# Patient Record
Sex: Female | Born: 1970 | Race: Black or African American | Hispanic: No | Marital: Married | Smoking: Current every day smoker
Health system: Southern US, Community
[De-identification: ages and names within clinical notes are randomized; demographics above are authoritative.]

## PROBLEM LIST (undated history)

## (undated) DIAGNOSIS — G43909 Migraine, unspecified, not intractable, without status migrainosus: Secondary | ICD-10-CM

## (undated) DIAGNOSIS — I10 Essential (primary) hypertension: Secondary | ICD-10-CM

## (undated) HISTORY — DX: Migraine, unspecified, not intractable, without status migrainosus: G43.909

## (undated) HISTORY — DX: Essential (primary) hypertension: I10

---

## 1998-06-01 ENCOUNTER — Other Ambulatory Visit: Admission: RE | Admit: 1998-06-01 | Discharge: 1998-06-01 | Payer: Self-pay | Admitting: Obstetrics and Gynecology

## 1999-12-05 ENCOUNTER — Emergency Department (HOSPITAL_COMMUNITY): Admission: EM | Admit: 1999-12-05 | Discharge: 1999-12-05 | Payer: Self-pay | Admitting: Emergency Medicine

## 2001-02-28 ENCOUNTER — Inpatient Hospital Stay (HOSPITAL_COMMUNITY): Admission: AD | Admit: 2001-02-28 | Discharge: 2001-02-28 | Payer: Self-pay | Admitting: Obstetrics and Gynecology

## 2001-07-31 ENCOUNTER — Encounter: Payer: Self-pay | Admitting: Emergency Medicine

## 2001-07-31 ENCOUNTER — Emergency Department (HOSPITAL_COMMUNITY): Admission: EM | Admit: 2001-07-31 | Discharge: 2001-07-31 | Payer: Self-pay | Admitting: Emergency Medicine

## 2001-08-27 ENCOUNTER — Inpatient Hospital Stay (HOSPITAL_COMMUNITY): Admission: AD | Admit: 2001-08-27 | Discharge: 2001-08-27 | Payer: Self-pay | Admitting: Obstetrics

## 2001-09-06 ENCOUNTER — Inpatient Hospital Stay (HOSPITAL_COMMUNITY): Admission: AD | Admit: 2001-09-06 | Discharge: 2001-09-06 | Payer: Self-pay | Admitting: *Deleted

## 2001-09-28 ENCOUNTER — Inpatient Hospital Stay (HOSPITAL_COMMUNITY): Admission: AD | Admit: 2001-09-28 | Discharge: 2001-09-28 | Payer: Self-pay | Admitting: Obstetrics and Gynecology

## 2001-12-23 ENCOUNTER — Encounter: Payer: Self-pay | Admitting: Emergency Medicine

## 2001-12-23 ENCOUNTER — Emergency Department (HOSPITAL_COMMUNITY): Admission: EM | Admit: 2001-12-23 | Discharge: 2001-12-23 | Payer: Self-pay | Admitting: Emergency Medicine

## 2002-02-17 ENCOUNTER — Emergency Department (HOSPITAL_COMMUNITY): Admission: EM | Admit: 2002-02-17 | Discharge: 2002-02-17 | Payer: Self-pay | Admitting: Emergency Medicine

## 2002-04-22 ENCOUNTER — Emergency Department (HOSPITAL_COMMUNITY): Admission: EM | Admit: 2002-04-22 | Discharge: 2002-04-22 | Payer: Self-pay | Admitting: Emergency Medicine

## 2003-01-12 ENCOUNTER — Emergency Department (HOSPITAL_COMMUNITY): Admission: EM | Admit: 2003-01-12 | Discharge: 2003-01-12 | Payer: Self-pay | Admitting: Emergency Medicine

## 2003-01-12 ENCOUNTER — Encounter: Payer: Self-pay | Admitting: Emergency Medicine

## 2003-06-21 ENCOUNTER — Other Ambulatory Visit: Admission: RE | Admit: 2003-06-21 | Discharge: 2003-06-21 | Payer: Self-pay | Admitting: Obstetrics and Gynecology

## 2004-04-13 ENCOUNTER — Emergency Department (HOSPITAL_COMMUNITY): Admission: EM | Admit: 2004-04-13 | Discharge: 2004-04-13 | Payer: Self-pay | Admitting: Emergency Medicine

## 2004-12-25 ENCOUNTER — Emergency Department (HOSPITAL_COMMUNITY): Admission: EM | Admit: 2004-12-25 | Discharge: 2004-12-25 | Payer: Self-pay | Admitting: Emergency Medicine

## 2005-02-06 ENCOUNTER — Emergency Department (HOSPITAL_COMMUNITY): Admission: EM | Admit: 2005-02-06 | Discharge: 2005-02-06 | Payer: Self-pay | Admitting: Emergency Medicine

## 2005-04-06 ENCOUNTER — Emergency Department (HOSPITAL_COMMUNITY): Admission: EM | Admit: 2005-04-06 | Discharge: 2005-04-06 | Payer: Self-pay | Admitting: Emergency Medicine

## 2005-09-04 ENCOUNTER — Emergency Department (HOSPITAL_COMMUNITY): Admission: EM | Admit: 2005-09-04 | Discharge: 2005-09-04 | Payer: Self-pay | Admitting: Emergency Medicine

## 2005-09-16 ENCOUNTER — Emergency Department (HOSPITAL_COMMUNITY): Admission: EM | Admit: 2005-09-16 | Discharge: 2005-09-16 | Payer: Self-pay | Admitting: Family Medicine

## 2005-09-29 ENCOUNTER — Emergency Department (HOSPITAL_COMMUNITY): Admission: EM | Admit: 2005-09-29 | Discharge: 2005-09-29 | Payer: Self-pay | Admitting: Emergency Medicine

## 2005-10-03 ENCOUNTER — Emergency Department (HOSPITAL_COMMUNITY): Admission: EM | Admit: 2005-10-03 | Discharge: 2005-10-03 | Payer: Self-pay | Admitting: Podiatry

## 2005-10-04 ENCOUNTER — Emergency Department (HOSPITAL_COMMUNITY): Admission: EM | Admit: 2005-10-04 | Discharge: 2005-10-04 | Payer: Self-pay | Admitting: Emergency Medicine

## 2006-01-11 ENCOUNTER — Emergency Department (HOSPITAL_COMMUNITY): Admission: EM | Admit: 2006-01-11 | Discharge: 2006-01-11 | Payer: Self-pay | Admitting: Emergency Medicine

## 2006-04-11 ENCOUNTER — Emergency Department (HOSPITAL_COMMUNITY): Admission: EM | Admit: 2006-04-11 | Discharge: 2006-04-11 | Payer: Self-pay | Admitting: Emergency Medicine

## 2006-06-28 ENCOUNTER — Emergency Department (HOSPITAL_COMMUNITY): Admission: EM | Admit: 2006-06-28 | Discharge: 2006-06-28 | Payer: Self-pay | Admitting: Emergency Medicine

## 2006-07-21 ENCOUNTER — Emergency Department (HOSPITAL_COMMUNITY): Admission: EM | Admit: 2006-07-21 | Discharge: 2006-07-21 | Payer: Self-pay | Admitting: Emergency Medicine

## 2006-08-12 ENCOUNTER — Inpatient Hospital Stay (HOSPITAL_COMMUNITY): Admission: AD | Admit: 2006-08-12 | Discharge: 2006-08-12 | Payer: Self-pay | Admitting: Obstetrics and Gynecology

## 2006-09-12 ENCOUNTER — Other Ambulatory Visit: Admission: RE | Admit: 2006-09-12 | Discharge: 2006-09-12 | Payer: Self-pay | Admitting: Gynecology

## 2006-09-30 ENCOUNTER — Ambulatory Visit (HOSPITAL_COMMUNITY): Admission: RE | Admit: 2006-09-30 | Discharge: 2006-09-30 | Payer: Self-pay | Admitting: Gynecology

## 2007-02-09 IMAGING — CT CT ABDOMEN W/O CM
1 series · 15 of 32 positions shown, 19 images · IV contrast (agent unspecified)
Comparison: none

CLINICAL DATA: Back pain.  
 ABDOMEN CT WITHOUT CONTRAST:
TECHNIQUE: Multidetector CT imaging of the abdomen was performed following the standard protocol without IV contrast.
TECHNIQUE: Multidetector CT imaging of the pelvis was performed following the standard protocol without IV contrast.

[Series 2: stone_wo 5.0 b40f st · axial · 0.62mm/px · z∈[-352,-24]mm · 15 of 91 slices shown, 19 images]
[im 6/91  soft-tissue]
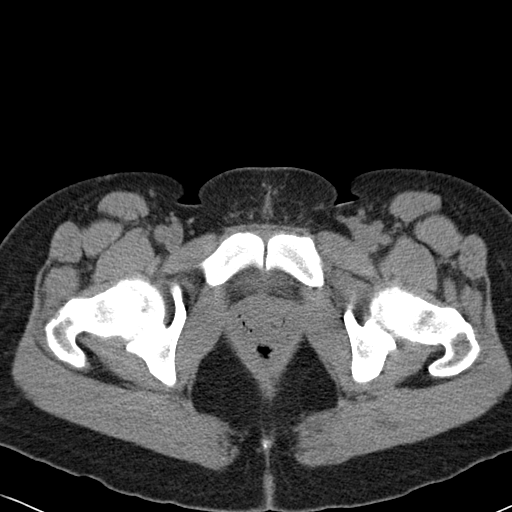
[im 6/91  bone]
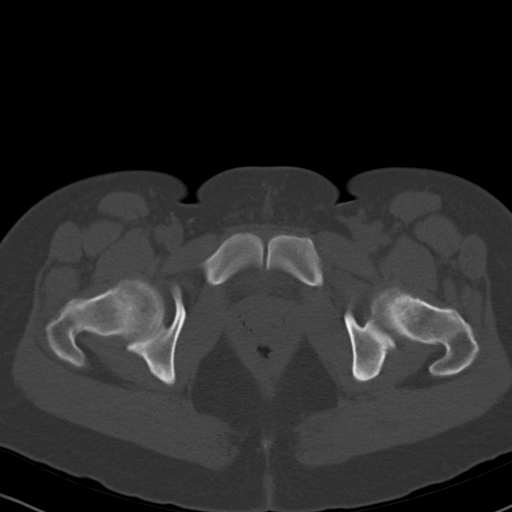
[im 12/91  soft-tissue]
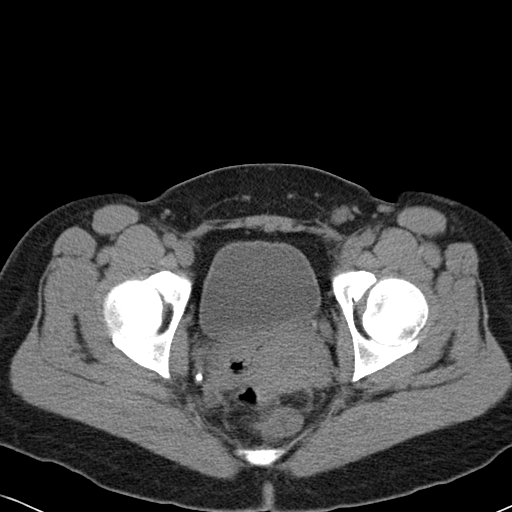
[im 18/91  soft-tissue]
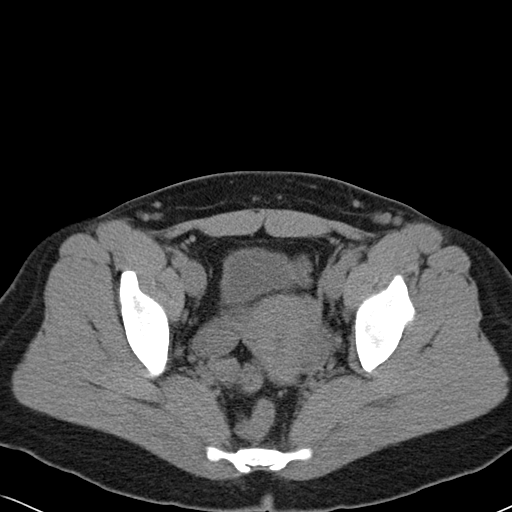
[im 27/91  soft-tissue]
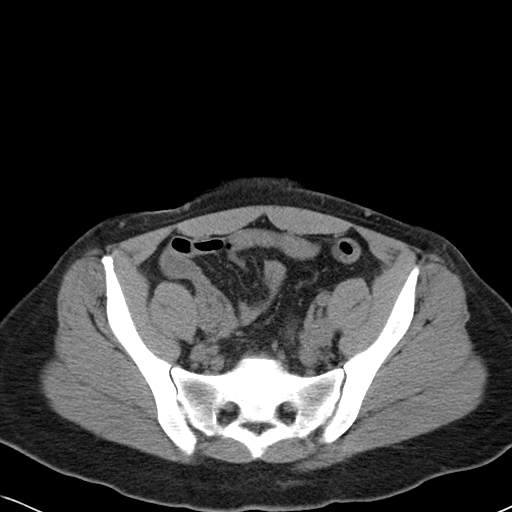
[im 32/91  soft-tissue]
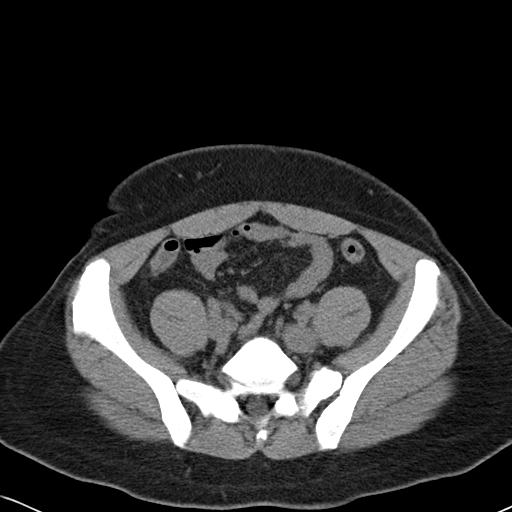
[im 38/91  soft-tissue]
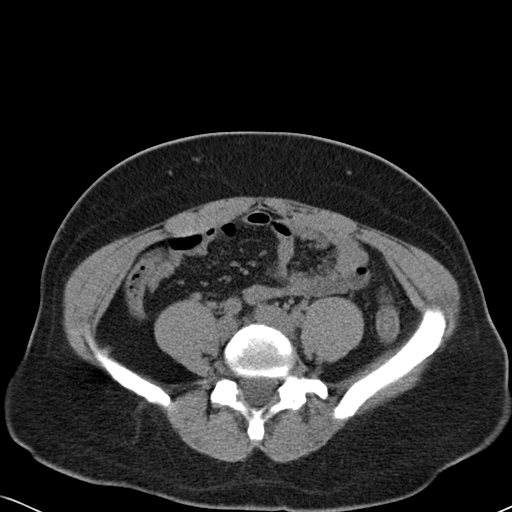
[im 47/91  soft-tissue]
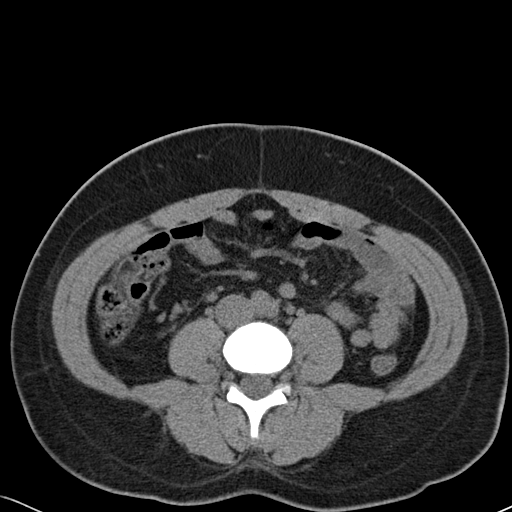
[im 53/91  soft-tissue]
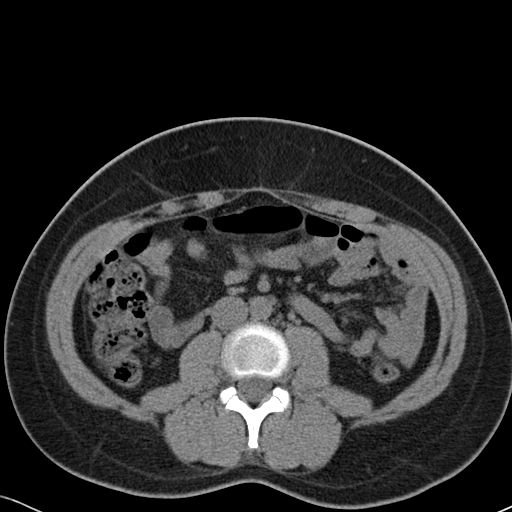
[im 59/91  soft-tissue]
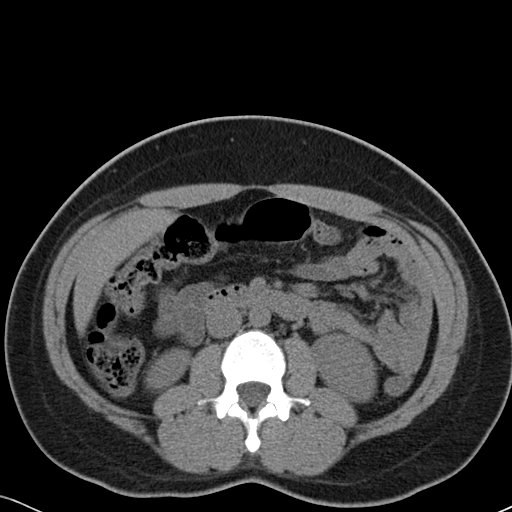
[im 59/91  bone]
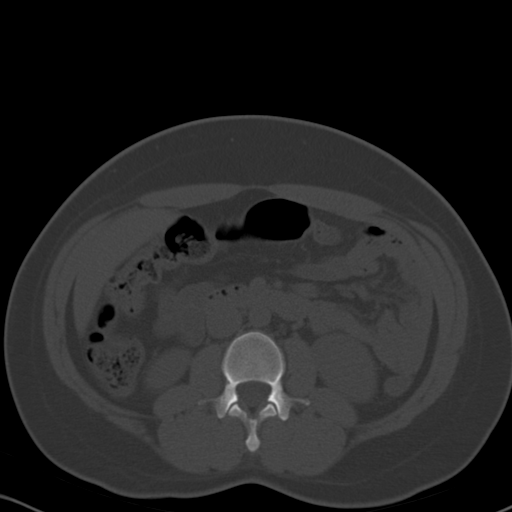
[im 64/91  soft-tissue]
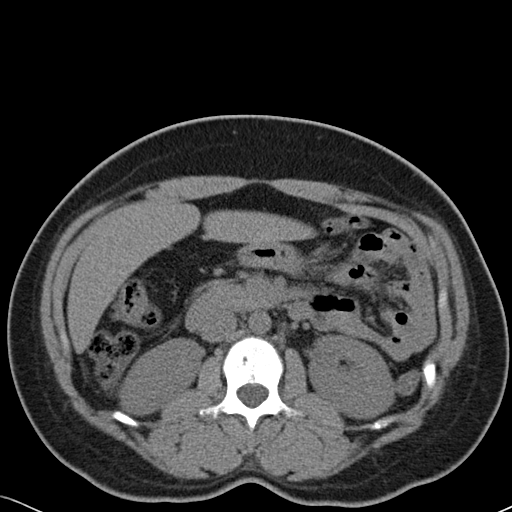
[im 73/91  soft-tissue]
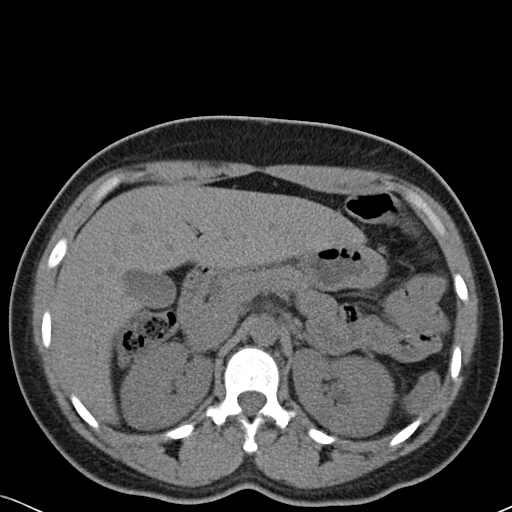
[im 79/91  soft-tissue]
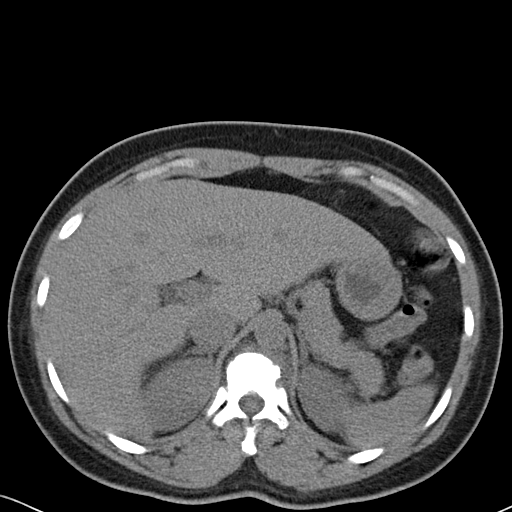
[im 79/91  lung]
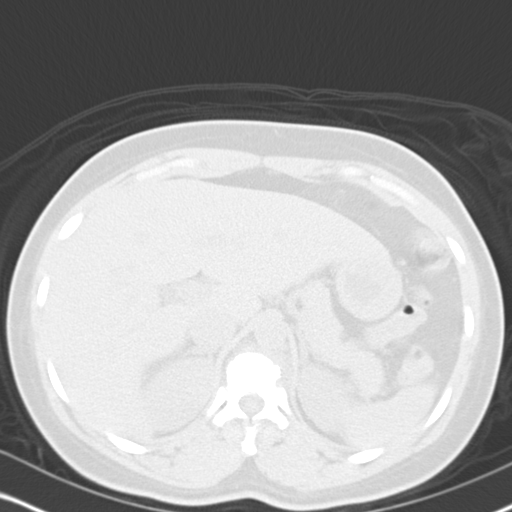
[im 82/91  lung]
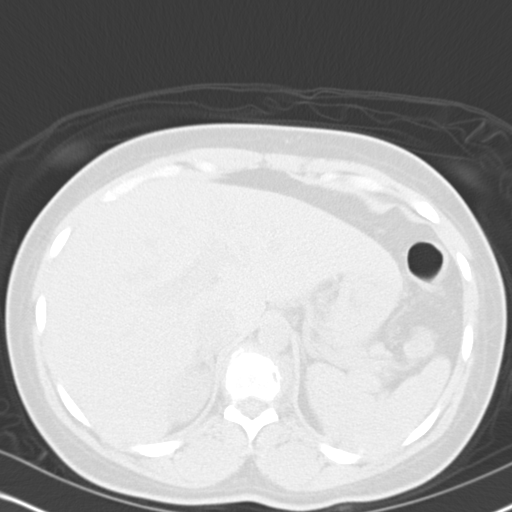
[im 85/91  soft-tissue]
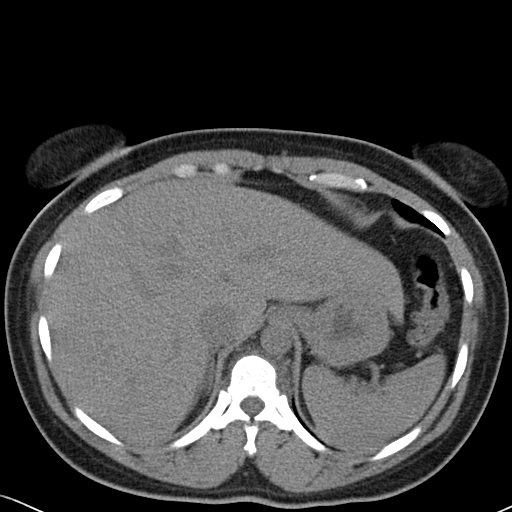
[im 85/91  lung]
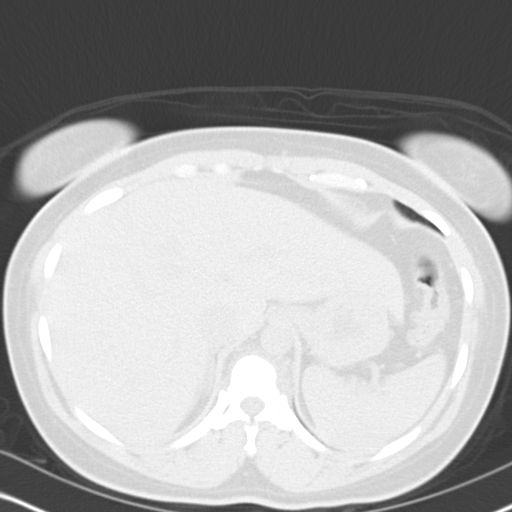
[im 88/91  lung]
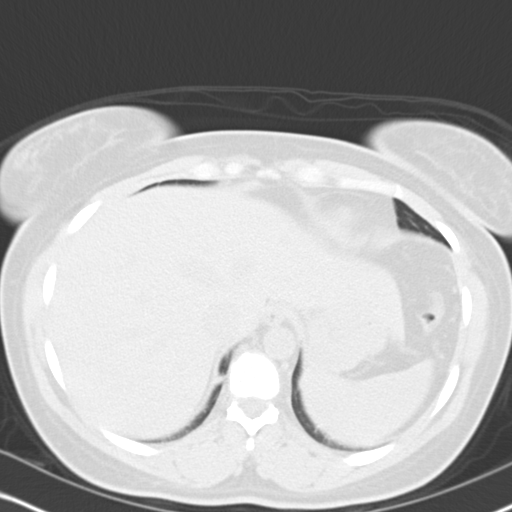

[15 of 32 positions shown; findings below may reference images not displayed]

FINDINGS: The liver appears normal in the unenhanced state.  No calcified gallstones are seen.  The pancreas is normal in size as are the adrenal glands and the spleen.  The kidneys enhance normally with no renal calculi and no hydronephrosis.  The abdominal aorta is normal in caliber.
IMPRESSION: Negative CT of the abdomen.  No renal calculi.  No hydronephrosis.
 PELVIS CT WITHOUT CONTRAST:
FINDINGS: Scans were continued through the pelvis in the unenhanced state.  The appendix is well seen and appears normal.  The terminal ileum also is normal.  The urinary bladder is unremarkable.  The uterus is grossly normal.  Small ovarian follicles are present.  No free fluid is noted.
IMPRESSION: No acute abnormality on CT of the pelvis.  Small ovarian follicles.  The appendix and terminal ileum appear normal.

## 2007-03-06 ENCOUNTER — Emergency Department (HOSPITAL_COMMUNITY): Admission: EM | Admit: 2007-03-06 | Discharge: 2007-03-07 | Payer: Self-pay | Admitting: Emergency Medicine

## 2007-05-22 ENCOUNTER — Emergency Department (HOSPITAL_COMMUNITY): Admission: EM | Admit: 2007-05-22 | Discharge: 2007-05-23 | Payer: Self-pay | Admitting: Emergency Medicine

## 2007-10-06 ENCOUNTER — Emergency Department (HOSPITAL_COMMUNITY): Admission: EM | Admit: 2007-10-06 | Discharge: 2007-10-06 | Payer: Self-pay | Admitting: Emergency Medicine

## 2008-03-15 ENCOUNTER — Inpatient Hospital Stay (HOSPITAL_COMMUNITY): Admission: AD | Admit: 2008-03-15 | Discharge: 2008-03-16 | Payer: Self-pay | Admitting: Obstetrics & Gynecology

## 2008-12-23 ENCOUNTER — Inpatient Hospital Stay (HOSPITAL_COMMUNITY): Admission: AD | Admit: 2008-12-23 | Discharge: 2008-12-24 | Payer: Self-pay | Admitting: Obstetrics & Gynecology

## 2009-01-06 ENCOUNTER — Inpatient Hospital Stay (HOSPITAL_COMMUNITY): Admission: AD | Admit: 2009-01-06 | Discharge: 2009-01-06 | Payer: Self-pay | Admitting: Family Medicine

## 2009-02-02 ENCOUNTER — Ambulatory Visit: Payer: Self-pay | Admitting: Obstetrics and Gynecology

## 2009-06-23 ENCOUNTER — Emergency Department (HOSPITAL_COMMUNITY): Admission: EM | Admit: 2009-06-23 | Discharge: 2009-06-23 | Payer: Self-pay | Admitting: Emergency Medicine

## 2009-06-27 ENCOUNTER — Emergency Department (HOSPITAL_COMMUNITY): Admission: EM | Admit: 2009-06-27 | Discharge: 2009-06-27 | Payer: Self-pay | Admitting: Emergency Medicine

## 2009-07-08 ENCOUNTER — Emergency Department (HOSPITAL_COMMUNITY): Admission: EM | Admit: 2009-07-08 | Discharge: 2009-07-08 | Payer: Self-pay | Admitting: Emergency Medicine

## 2009-11-22 ENCOUNTER — Emergency Department (HOSPITAL_COMMUNITY): Admission: EM | Admit: 2009-11-22 | Discharge: 2009-11-22 | Payer: Self-pay | Admitting: Family Medicine

## 2010-01-31 LAB — HM PAP SMEAR

## 2010-05-03 ENCOUNTER — Emergency Department (HOSPITAL_COMMUNITY): Admission: EM | Admit: 2010-05-03 | Discharge: 2010-05-03 | Payer: Self-pay | Admitting: Emergency Medicine

## 2010-05-07 ENCOUNTER — Emergency Department (HOSPITAL_COMMUNITY): Admission: EM | Admit: 2010-05-07 | Discharge: 2010-05-07 | Payer: Self-pay | Admitting: Emergency Medicine

## 2010-09-04 ENCOUNTER — Inpatient Hospital Stay (HOSPITAL_COMMUNITY)
Admission: AD | Admit: 2010-09-04 | Discharge: 2010-09-05 | Payer: Self-pay | Source: Home / Self Care | Attending: Obstetrics & Gynecology | Admitting: Obstetrics & Gynecology

## 2010-09-11 LAB — GC/CHLAMYDIA PROBE AMP, GENITAL
Chlamydia, DNA Probe: NEGATIVE
GC Probe Amp, Genital: NEGATIVE

## 2010-09-11 LAB — CBC
HCT: 40.9 % (ref 36.0–46.0)
Hemoglobin: 14 g/dL (ref 12.0–15.0)
MCH: 30.6 pg (ref 26.0–34.0)
MCHC: 34.2 g/dL (ref 30.0–36.0)
MCV: 89.3 fL (ref 78.0–100.0)
Platelets: 264 10*3/uL (ref 150–400)
RBC: 4.58 MIL/uL (ref 3.87–5.11)
RDW: 12.7 % (ref 11.5–15.5)
WBC: 5.5 10*3/uL (ref 4.0–10.5)

## 2010-09-11 LAB — WET PREP, GENITAL
Trich, Wet Prep: NONE SEEN
Yeast Wet Prep HPF POC: NONE SEEN

## 2010-09-11 LAB — COMPREHENSIVE METABOLIC PANEL
ALT: 18 U/L (ref 0–35)
AST: 20 U/L (ref 0–37)
Albumin: 3.7 g/dL (ref 3.5–5.2)
Alkaline Phosphatase: 72 U/L (ref 39–117)
BUN: 11 mg/dL (ref 6–23)
CO2: 26 mEq/L (ref 19–32)
Calcium: 9.1 mg/dL (ref 8.4–10.5)
Chloride: 106 mEq/L (ref 96–112)
Creatinine, Ser: 0.96 mg/dL (ref 0.4–1.2)
GFR calc Af Amer: >60 mL/min (ref >60)
GFR calc non Af Amer: >60 mL/min (ref >60)
Glucose, Bld: 100 mg/dL — ABNORMAL HIGH (ref 70–99)
Potassium: 3.9 mEq/L (ref 3.5–5.1)
Sodium: 137 mEq/L (ref 135–145)
Total Bilirubin: 0.2 mg/dL — ABNORMAL LOW (ref 0.3–1.2)
Total Protein: 6.6 g/dL (ref 6.0–8.3)

## 2010-09-11 LAB — DIFFERENTIAL
Basophils Absolute: 0 10*3/uL (ref 0.0–0.1)
Basophils Relative: 0 % (ref 0–1)
Eosinophils Absolute: 0.2 10*3/uL (ref 0.0–0.7)
Eosinophils Relative: 3 % (ref 0–5)
Lymphocytes Relative: 37 % (ref 12–46)
Lymphs Abs: 2 10*3/uL (ref 0.7–4.0)
Monocytes Absolute: 0.4 10*3/uL (ref 0.1–1.0)
Monocytes Relative: 8 % (ref 3–12)
Neutro Abs: 2.9 10*3/uL (ref 1.7–7.7)
Neutrophils Relative %: 52 % (ref 43–77)

## 2010-09-11 LAB — URINALYSIS, ROUTINE W REFLEX MICROSCOPIC
Bilirubin Urine: NEGATIVE
Hgb urine dipstick: NEGATIVE
Ketones, ur: NEGATIVE mg/dL
Nitrite: NEGATIVE
Protein, ur: NEGATIVE mg/dL
Specific Gravity, Urine: 1.015 (ref 1.005–1.030)
Urine Glucose, Fasting: NEGATIVE mg/dL
Urobilinogen, UA: 0.2 mg/dL (ref 0.0–1.0)
pH: 7 (ref 5.0–8.0)

## 2010-09-11 LAB — POCT PREGNANCY, URINE: Preg Test, Ur: NEGATIVE

## 2010-11-30 LAB — URINALYSIS, ROUTINE W REFLEX MICROSCOPIC
Glucose, UA: NEGATIVE mg/dL
Ketones, ur: NEGATIVE mg/dL
pH: 5.5 (ref 5.0–8.0)

## 2010-11-30 LAB — URINE MICROSCOPIC-ADD ON

## 2010-12-05 LAB — CBC
HCT: 39.4 % (ref 36.0–46.0)
MCHC: 34.8 g/dL (ref 30.0–36.0)
MCV: 92.2 fL (ref 78.0–100.0)
Platelets: 245 10*3/uL (ref 150–400)
RDW: 13.3 % (ref 11.5–15.5)

## 2010-12-05 LAB — SAMPLE TO BLOOD BANK

## 2011-05-25 LAB — URINE MICROSCOPIC-ADD ON

## 2011-05-25 LAB — DIFFERENTIAL
Eosinophils Absolute: 0.2
Eosinophils Relative: 3
Lymphs Abs: 1.4
Monocytes Relative: 9

## 2011-05-25 LAB — URINALYSIS, ROUTINE W REFLEX MICROSCOPIC
Bilirubin Urine: NEGATIVE
Nitrite: NEGATIVE
Protein, ur: NEGATIVE
Specific Gravity, Urine: 1.01
Urobilinogen, UA: 1

## 2011-05-25 LAB — CBC
HCT: 40.6
Hemoglobin: 13.7
MCV: 92.1
RBC: 4.41
WBC: 4.7

## 2011-05-25 LAB — WET PREP, GENITAL: Trich, Wet Prep: NONE SEEN

## 2011-05-25 LAB — POCT PREGNANCY, URINE: Operator id: 26670

## 2011-05-28 ENCOUNTER — Encounter: Payer: Self-pay | Admitting: *Deleted

## 2011-05-28 DIAGNOSIS — I10 Essential (primary) hypertension: Secondary | ICD-10-CM | POA: Insufficient documentation

## 2011-05-28 DIAGNOSIS — G43909 Migraine, unspecified, not intractable, without status migrainosus: Secondary | ICD-10-CM | POA: Insufficient documentation

## 2011-06-24 ENCOUNTER — Inpatient Hospital Stay (INDEPENDENT_AMBULATORY_CARE_PROVIDER_SITE_OTHER)
Admission: RE | Admit: 2011-06-24 | Discharge: 2011-06-24 | Disposition: A | Discharge status: Home / Self Care | Payer: Managed Care, Other (non HMO) | Source: Ambulatory Visit | Attending: Family Medicine | Admitting: Family Medicine

## 2011-06-24 DIAGNOSIS — J069 Acute upper respiratory infection, unspecified: Secondary | ICD-10-CM

## 2011-12-03 ENCOUNTER — Emergency Department (HOSPITAL_COMMUNITY)
Admission: EM | Admit: 2011-12-03 | Discharge: 2011-12-03 | Disposition: A | Discharge status: Home / Self Care | Payer: Self-pay | Attending: Emergency Medicine | Admitting: Emergency Medicine

## 2011-12-03 ENCOUNTER — Encounter (HOSPITAL_COMMUNITY): Payer: Self-pay | Admitting: Emergency Medicine

## 2011-12-03 DIAGNOSIS — M25539 Pain in unspecified wrist: Secondary | ICD-10-CM | POA: Insufficient documentation

## 2011-12-03 MED ORDER — ACETAMINOPHEN-CODEINE #3 300-30 MG PO TABS: 1.0000 | ORAL_TABLET | Freq: Four times a day (QID) | ORAL | Status: AC | PRN

## 2011-12-03 MED ORDER — IBUPROFEN 800 MG PO TABS: 800.0000 mg | ORAL_TABLET | Freq: Three times a day (TID) | ORAL | Status: AC

## 2011-12-03 MED ORDER — OXYCODONE-ACETAMINOPHEN 5-325 MG PO TABS
1.0000 | ORAL_TABLET | Freq: Once | ORAL | Status: AC
  Administered 2011-12-03: 1 via ORAL
  Filled 2011-12-03: qty 1

## 2011-12-03 MED ORDER — IBUPROFEN 800 MG PO TABS
800.0000 mg | ORAL_TABLET | Freq: Once | ORAL | Status: AC
  Administered 2011-12-03: 800 mg via ORAL
  Filled 2011-12-03: qty 1

## 2011-12-03 NOTE — Discharge Instructions (Signed)
Wear splint for comfort. Ice wrist to reduce inflammation and pain. Take ibuprofen as directed for inflammation and pain with tylenol #3 for breakthrough pain but do not drive or operate machinery with tylenol #3 use. Follow up with hand specialist in 1-2 weeks for recheck of ongoing hand pain but return to ER for emergent changing or worsening of symptoms.

## 2011-12-03 NOTE — ED Notes (Signed)
Pt is c/o right hand pain  Pt denies injury  Pt states the pain is in the wrist area  No signs of swelling noted

## 2011-12-04 NOTE — ED Provider Notes (Signed)
History     CSN: [id number]  Arrival date & time 12/03/11  2102   First MD Initiated Contact with Patient 12/03/11 2311      Chief Complaint  Patient presents with  . Hand Pain    (Consider location/radiation/quality/duration/timing/severity/associated sxs/prior treatment) HPI  Patient presents to ER complaining of a few day hx of gradual onset right wrist pain. 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 Pain is aggravated by movement and improved with keeping wrist still. Has taken nothing for pain PTA. Denies erythema or swelling of wrist. Denies addition joint pain. Denies hx if similar pain. Denies radiation of pain. Pain was gradual onset, persistent and aggravated by movement.  Past Medical History  Diagnosis Date  . Migraines   . Asthma   . Hypertension     History reviewed. No pertinent past surgical history.  Family History  Problem Relation Age of Onset  . Diabetes Father   . Heart attack Father   . Hypertension Father   . Cancer Father   . Diabetes Mother   . Hypertension Mother     History  Substance Use Topics  . Smoking status: Current Everyday Smoker    Types: Cigarettes  . Smokeless tobacco: Not on file  . Alcohol Use: No    OB History    Grav Para Term Preterm Abortions TAB SAB Ect Mult Living   0 0 0 0 0 0 0 0 0 0       Review of Systems  Allergies  Review of patient's allergies indicates no known allergies.  Home Medications   Current Outpatient Rx  Name Route Sig Dispense Refill  . AZOR PO Oral Take 5 mg by mouth daily.    . ACETAMINOPHEN-CODEINE #3 300-30 MG PO TABS Oral Take 1-2 tablets by mouth every 6 (six) hours as needed for pain. 15 tablet 0  . IBUPROFEN 800 MG PO TABS Oral Take 1 tablet (800 mg total) by mouth 3 (three) times daily. 21 tablet 0  . METOCLOPRAMIDE HCL 5 MG PO TABS Oral Take 5 mg by mouth 4 (four) times daily as needed.      . TOPIRAMATE 100 MG PO TABS Oral Take 100 mg by mouth  daily.        BP 129/94  Pulse 88  Temp(Src) 98.9 F (37.2 C) (Oral)  SpO2 98%  LMP 11/30/2011  Physical Exam  Constitutional: She is oriented to person, place, and time. She appears well-developed and well-nourished. No distress.  HENT:  Head: Normocephalic and atraumatic.  Eyes: Conjunctivae are normal.  Cardiovascular: Normal rate and regular rhythm.   Pulmonary/Chest: Effort normal.  Musculoskeletal: She exhibits no edema.       Right ankle: She exhibits swelling. tenderness.       TTP of entire right wrist with decreased ROM due to pain but no erythema or swelling. Good radial pulse and cap refill. 5/5 grip. No TTP or swelling of forearm.    Neurological: She is alert and oriented to person, place, and time.       Normal sensation of entire foot.   Skin: Skin is warm and dry. No rash noted. She is not diaphoretic. No erythema. No pallor.  Psychiatric: She has a normal mood and affect. Her behavior is normal.    ED Course  Procedures (including critical care time)  PO ibuprofen, percocet and wrist splint by ortho tech.   Labs Reviewed - No data to display No  results found.   1. Pain in wrist       MDM  No signs or symptoms of septic joint. No known injury. Patient complaining of pain with ROM but RUE in neurovasc intact. Good radial pulse and 5/5 grip. 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         [place], [place] 12/04/11 0151

## 2011-12-04 NOTE — ED Provider Notes (Signed)
Medical screening examination/treatment/procedure(s) were performed by non-physician practitioner and as supervising physician I was immediately available for consultation/collaboration.  Doug Sou, MD 12/04/11 [phone]

## 2011-12-23 ENCOUNTER — Inpatient Hospital Stay (HOSPITAL_COMMUNITY): Payer: Self-pay

## 2011-12-23 ENCOUNTER — Inpatient Hospital Stay (HOSPITAL_COMMUNITY)
Admission: AD | Admit: 2011-12-23 | Discharge: 2011-12-23 | Disposition: A | Discharge status: Home / Self Care | Payer: Self-pay | Source: Ambulatory Visit | Attending: Family Medicine | Admitting: Family Medicine

## 2011-12-23 ENCOUNTER — Encounter (HOSPITAL_COMMUNITY): Payer: Self-pay | Admitting: *Deleted

## 2011-12-23 DIAGNOSIS — N390 Urinary tract infection, site not specified: Secondary | ICD-10-CM | POA: Insufficient documentation

## 2011-12-23 DIAGNOSIS — R109 Unspecified abdominal pain: Secondary | ICD-10-CM | POA: Insufficient documentation

## 2011-12-23 DIAGNOSIS — N760 Acute vaginitis: Secondary | ICD-10-CM | POA: Insufficient documentation

## 2011-12-23 DIAGNOSIS — A499 Bacterial infection, unspecified: Secondary | ICD-10-CM | POA: Insufficient documentation

## 2011-12-23 DIAGNOSIS — N83209 Unspecified ovarian cyst, unspecified side: Secondary | ICD-10-CM | POA: Insufficient documentation

## 2011-12-23 DIAGNOSIS — B9689 Other specified bacterial agents as the cause of diseases classified elsewhere: Secondary | ICD-10-CM | POA: Insufficient documentation

## 2011-12-23 LAB — URINALYSIS, ROUTINE W REFLEX MICROSCOPIC
Nitrite: POSITIVE — AB
Protein, ur: NEGATIVE mg/dL
Specific Gravity, Urine: 1.02 (ref 1.005–1.030)
Urobilinogen, UA: 1 mg/dL (ref 0.0–1.0)

## 2011-12-23 LAB — URINE MICROSCOPIC-ADD ON

## 2011-12-23 LAB — CBC
HCT: 38.1 % (ref 36.0–46.0)
Hemoglobin: 13 g/dL (ref 12.0–15.0)
MCH: 30.7 pg (ref 26.0–34.0)
MCV: 89.9 fL (ref 78.0–100.0)
RBC: 4.24 MIL/uL (ref 3.87–5.11)

## 2011-12-23 LAB — WET PREP, GENITAL: Yeast Wet Prep HPF POC: NONE SEEN

## 2011-12-23 MED ORDER — METRONIDAZOLE 500 MG PO TABS: 500.0000 mg | ORAL_TABLET | Freq: Two times a day (BID) | ORAL | Status: AC

## 2011-12-23 MED ORDER — CIPROFLOXACIN HCL 500 MG PO TABS: 500.0000 mg | ORAL_TABLET | Freq: Two times a day (BID) | ORAL | Status: AC

## 2011-12-23 MED ORDER — KETOROLAC TROMETHAMINE 10 MG PO TABS: 10.0000 mg | ORAL_TABLET | Freq: Four times a day (QID) | ORAL | Status: AC | PRN

## 2011-12-23 MED ORDER — OXYCODONE-ACETAMINOPHEN 5-325 MG PO TABS: 1.0000 | ORAL_TABLET | ORAL | Status: AC | PRN

## 2011-12-23 NOTE — MAU Provider Note (Signed)
Cindy Peters [id number]. LMP 11/26/11 Chief Complaint  Patient presents with  . Abdominal Pain     First Provider Initiated Contact with Patient 12/23/11 0159      SUBJECTIVE  HPI: The pt presents to MAU reporting RLQ pain and bilat breast pain x 4 days. She describes the abd pain as constant, sharp, better w/ pressure applied. She reports nausea, loss of appetite and denies fever, chills, vomiting, diarrhea, constipation, UTI Sx. Declines pain meds.  Past Medical History  Diagnosis Date  . Migraines   . Asthma   . Hypertension    History reviewed. No pertinent past surgical history. History   Social History  . Marital Status: Married    Spouse Name: N/A    Number of Children: N/A  . Years of Education: N/A   Occupational History  . Not on file.   Social History Main Topics  . Smoking status: Current Everyday Smoker    Types: Cigarettes  . Smokeless tobacco: Not on file  . Alcohol Use: No  . Drug Use: No  . Sexually Active: Yes   Other Topics Concern  . Not on file   Social History Narrative  . No narrative on file   No current facility-administered medications on file prior to encounter.   Current Outpatient Prescriptions on File Prior to Encounter  Medication Sig Dispense Refill  . Amlodipine-Olmesartan (AZOR PO) Take 5 mg by mouth daily.      . metoCLOPramide (REGLAN) 5 MG tablet Take 5 mg by mouth 4 (four) times daily as needed.        . topiramate (TOPAMAX) 100 MG tablet Take 100 mg by mouth daily.         No Known Allergies  ROS: Pertinent items in HPI  OBJECTIVE Blood pressure 150/95, pulse 77, temperature 98.4 F (36.9 C), temperature source Oral, resp. rate 20, height 5\' 4"  (1.626 m), weight 83.462 kg (184 lb), last menstrual period 11/26/2011.  GENERAL: Well-developed, well-nourished female in mild distress.  HEENT: Normocephalic HEART: normal rate RESP: normal effort ABDOMEN: Soft, moderately tender in RLQ, Pos rebound and guarding, neg  for mass. EXTREMITIES: Nontender, no edema NEURO: Alert and oriented SPECULUM EXAM: physiologic discharge, no blood noted, cervix clean BIMANUAL: cervix 0; uterus normal size, mildly tender; right adnexa tender. No mass. Left adnexal nontender, no mass.  LAB RESULTS Recent Results (from the past 336 hour(s))  URINALYSIS, ROUTINE W REFLEX MICROSCOPIC   Collection Time   12/23/11  1:32 AM      Component Value Range   Color, Urine YELLOW  YELLOW    APPearance HAZY (*) CLEAR    Specific Gravity, Urine 1.020  1.005 - 1.030    pH 6.0  5.0 - 8.0    Glucose, UA NEGATIVE  NEGATIVE (mg/dL)   Hgb urine dipstick LARGE (*) NEGATIVE    Bilirubin Urine NEGATIVE  NEGATIVE    Ketones, ur NEGATIVE  NEGATIVE (mg/dL)   Protein, ur NEGATIVE  NEGATIVE (mg/dL)   Urobilinogen, UA 1.0  0.0 - 1.0 (mg/dL)   Nitrite POSITIVE (*) NEGATIVE    Leukocytes, UA SMALL (*) NEGATIVE   URINE MICROSCOPIC-ADD ON   Collection Time   12/23/11  1:32 AM      Component Value Range   Squamous Epithelial / LPF FEW (*) RARE    WBC, UA 11-20  <3 (WBC/hpf)   RBC / HPF 21-50  <3 (RBC/hpf)   Bacteria, UA FEW (*) RARE    Urine-Other MUCOUS PRESENT  POCT PREGNANCY, URINE   Collection Time   12/23/11  1:34 AM      Component Value Range   Preg Test, Ur NEGATIVE  NEGATIVE   WET PREP, GENITAL   Collection Time   12/23/11  2:03 AM      Component Value Range   Yeast Wet Prep HPF POC NONE SEEN  NONE SEEN    Trich, Wet Prep NONE SEEN  NONE SEEN    Clue Cells Wet Prep HPF POC MANY (*) NONE SEEN    WBC, Wet Prep HPF POC RARE (*) NONE SEEN   GC/CHLAMYDIA PROBE AMP, GENITAL   Collection Time   12/23/11  2:03 AM      Component Value Range   GC Probe Amp, Genital PENDING  NEGATIVE    Chlamydia, DNA Probe PENDING  NEGATIVE   CBC   Collection Time   12/23/11  2:09 AM      Component Value Range   WBC 6.1  4.0 - 10.5 (K/uL)   RBC 4.24  3.87 - 5.11 (MIL/uL)   Hemoglobin 13.0  12.0 - 15.0 (g/dL)   HCT 96.0  45.4 - 09.8 (%)   MCV 89.9   78.0 - 100.0 (fL)   MCH 30.7  26.0 - 34.0 (pg)   MCHC 34.1  30.0 - 36.0 (g/dL)   RDW 11.9  14.7 - 82.9 (%)   Platelets 248  150 - 400 (K/uL)   IMAGING US Transvaginal Non-ob  12/23/2011  *RADIOLOGY REPORT*  Clinical Data: Right lower quadrant pain  TRANSABDOMINAL AND TRANSVAGINAL ULTRASOUND OF PELVIS Technique:  Both transabdominal and transvaginal ultrasound examinations of the pelvis were performed. Transabdominal technique was performed for global imaging of the pelvis including uterus, ovaries, adnexal regions, and pelvic cul-de-sac.  Comparison: 09/05/2010   It was necessary to proceed with endovaginal exam following the transabdominal exam to visualize the endometrium and adnexa.  Findings:  Uterus: Normal in size and appearance, measuring 7.7 x 3.5 x 4.5 cm.  Endometrium: Normal in thickness and appearance, measuring 8 mm  Right ovary:  Normal appearance/no adnexal mass, measuring 2.6 x 2.4 x 2.5 cm.  Left ovary: Measures 5.5 x 3.7 x 4.0 cm., containing a 4.3 x 3.6 x 3.2 cm lesion with an echogenic rim and central hypoechoic component with internal lace-like echogenicity.  Other findings: Trace free fluid  IMPRESSION: 4.3 x 3.6 x 3.2 cm left ovarian lesion, demonstrates an echogenic rim and central hypoechoic component with internal lace-like echogenicity. favored to reflect a complex/hemorrhagic cyst. Recommend 6-week ultrasound follow-up to document resolution.  Original Report Authenticated By: Waneta Martins, M.D.   US Pelvis Complete  12/23/2011  *RADIOLOGY REPORT*  Clinical Data: Right lower quadrant pain  TRANSABDOMINAL AND TRANSVAGINAL ULTRASOUND OF PELVIS Technique:  Both transabdominal and transvaginal ultrasound examinations of the pelvis were performed. Transabdominal technique was performed for global imaging of the pelvis including uterus, ovaries, adnexal regions, and pelvic cul-de-sac.  Comparison: 09/05/2010   It was necessary to proceed with endovaginal exam following the  transabdominal exam to visualize the endometrium and adnexa.  Findings:  Uterus: Normal in size and appearance, measuring 7.7 x 3.5 x 4.5 cm.  Endometrium: Normal in thickness and appearance, measuring 8 mm  Right ovary:  Normal appearance/no adnexal mass, measuring 2.6 x 2.4 x 2.5 cm.  Left ovary: Measures 5.5 x 3.7 x 4.0 cm., containing a 4.3 x 3.6 x 3.2 cm lesion with an echogenic rim and central hypoechoic component with internal lace-like echogenicity.  Other  findings: Trace free fluid  IMPRESSION: 4.3 x 3.6 x 3.2 cm left ovarian lesion, demonstrates an echogenic rim and central hypoechoic component with internal lace-like echogenicity. favored to reflect a complex/hemorrhagic cyst. Recommend 6-week ultrasound follow-up to document resolution.  Original Report Authenticated By: Waneta Martins, M.D.   ASSESSMENT 1. Hemorrhagic ovarian cyst   2. UTI (lower urinary tract infection)   3. BV (bacterial vaginosis)    PLAN D/C home Follow-up Information    Follow up with Gynecologist. Schedule an appointment as soon as possible for a visit in 6 weeks. (or MAU as needed if symptoms worsen)        If pain worsens and fever develops or GI Sx worsen, go to ED.  Medication List  As of 12/31/2011  4:28 PM   START taking these medications         ciprofloxacin 500 MG tablet   Commonly known as: CIPRO   Take 1 tablet (500 mg total) by mouth 2 (two) times daily.      metroNIDAZOLE 500 MG tablet   Commonly known as: FLAGYL   Take 1 tablet (500 mg total) by mouth 2 (two) times daily.      oxyCODONE-acetaminophen 5-325 MG per tablet   Commonly known as: PERCOCET   Take 1 tablet by mouth every 4 (four) hours as needed for pain.         CONTINUE taking these medications         AZOR PO      REGLAN 5 MG tablet   Generic drug: metoCLOPramide      topiramate 100 MG tablet   Commonly known as: TOPAMAX          Where to get your medications    These are the prescriptions that you need to  pick up.   You may get these medications from any pharmacy.         ciprofloxacin 500 MG tablet   metroNIDAZOLE 500 MG tablet   oxyCODONE-acetaminophen 5-325 MG per tablet           Saraiya Kozma 12/23/2011 2:02 AM

## 2011-12-23 NOTE — Discharge Instructions (Signed)
Prenatal Care Providers °[facility]    [facility] ° Phone- [phone]     Phone: [phone] °         °[facility]                      [facility] ° @[place]     Phone: [phone] ° Phone: [phone] °        [facility] °[facility]     Phone: [phone] ° Phone: [phone]   °        [facility] °[facility]                 hone: [phone] ° Phone: [phone] °        [facility] °Dr. Bernard Marshall      Phone: [phone] ° Phone: [phone] °         [facility] °[facility]                Phone: [phone] ° [facility]  ° Phone:[phone]    [facility] ([place]) °         Phone: [phone] °[facility] °  Phone: [phone] ° ° °Bacterial Vaginosis °Bacterial vaginosis (BV) is a vaginal infection where the normal balance of bacteria in the vagina is disrupted. The normal balance is then replaced by an overgrowth of certain bacteria. There are several different kinds of bacteria that can cause BV. BV is the most common vaginal infection in women of childbearing age. °CAUSES  °· The cause of BV is not fully understood. BV develops when there is an increase or imbalance of harmful bacteria.  °· Some activities or behaviors can upset the normal balance of bacteria in the vagina and put women at increased risk including:  °· Having a new sex partner or multiple sex partners.  °· Douching.  °· Using an intrauterine device (IUD) for contraception.  °· It is not clear what role sexual activity plays in the development of BV. However, women that have never had sexual intercourse are rarely infected with BV.  °Women do not get BV from toilet seats, bedding, swimming pools or from touching objects around them.  °SYMPTOMS  °· Grey vaginal discharge.  °· A fish-like odor with discharge, especially after sexual intercourse.   °· Itching or burning of the vagina and vulva.  °· Burning or pain with urination.  °· Some women have no signs or symptoms at all.  °DIAGNOSIS  °Your caregiver must examine the vagina for signs of BV. Your caregiver will perform lab tests and look at the sample of vaginal fluid through a microscope. They will look for bacteria and abnormal cells (clue cells), a pH test higher than 4.5, and a positive amine test all associated with BV.  °RISKS AND COMPLICATIONS  °· Pelvic inflammatory disease (PID).  °· Infections following gynecology surgery.  °· Developing HIV.  °· Developing herpes virus.  °TREATMENT  °Sometimes BV will clear up without treatment. However, all women with symptoms of BV should be treated to avoid complications, especially if gynecology surgery is planned. Female partners generally do not need to be treated. However, BV may spread between female sex partners so treatment is helpful in preventing a recurrence of BV.  °· BV may be treated with antibiotics. The antibiotics come in either pill or vaginal cream forms.   Either can be used with nonpregnant or pregnant women, but the recommended dosages differ. These antibiotics are not harmful to the baby.  °· BV can recur after treatment. If this happens, a second round of antibiotics will often be prescribed.  °· Treatment is important for pregnant women. If not treated, BV can cause a premature delivery, especially for a pregnant woman who had a premature birth in the past. All pregnant women who have symptoms of BV should be checked and treated.  °· For chronic reoccurrence of BV, treatment with a type of prescribed gel vaginally twice a week is helpful.  °HOME CARE INSTRUCTIONS  °· Finish all medication as directed by your caregiver.  °· Do not have sex until treatment is completed.  °· Tell your sexual partner that you have a vaginal infection. They should see their caregiver and be treated if they have problems, such as a mild rash or itching.   °· Practice safe sex. Use condoms. Only have 1 sex partner.  °PREVENTION  °Basic prevention steps can help reduce the risk of upsetting the natural balance of bacteria in the vagina and developing BV: °· Do not have sexual intercourse (be abstinent).  °· Do not douche.  °· Use all of the medicine prescribed for treatment of BV, even if the signs and symptoms go away.  °· Tell your sex partner if you have BV. That way, they can be treated, if needed, to prevent reoccurrence.  °SEEK MEDICAL CARE IF:  °· Your symptoms are not improving after 3 days of treatment.  °· You have increased discharge, pain, or fever.  °MAKE SURE YOU:  °· Understand these instructions.  °· Will watch your condition.  °· Will get help right away if you are not doing well or get worse.  °FOR MORE INFORMATION  °Division of STD Prevention (DSTDP), Centers for Disease Control and Prevention: www.cdc.gov/std °American Social Health Association (ASHA): www.ashastd.org  °Document Released: 08/13/2005 Document Revised: 08/02/2011 Document Reviewed: 02/03/2009 °ExitCare® Patient Information ©2012 [employer].Bacterial Vaginosis °Bacterial vaginosis (BV) is a vaginal infection where the normal balance of bacteria in the vagina is disrupted. The normal balance is then replaced by an overgrowth of certain bacteria. There are several different kinds of bacteria that can cause BV. BV is the most common vaginal infection in women of childbearing age. °CAUSES  °· The cause of BV is not fully understood. BV develops when there is an increase or imbalance of harmful bacteria.  °· Some activities or behaviors can upset the normal balance of bacteria in the vagina and put women at increased risk including:  °· Having a new sex partner or multiple sex partners.  °· Douching.  °· Using an intrauterine device (IUD) for contraception.  °· It is not clear what role sexual activity plays in the development of BV. However, women that have never had sexual intercourse  are rarely infected with BV.  °Women do not get BV from toilet seats, bedding, swimming pools or from touching objects around them.  °SYMPTOMS  °· Grey vaginal discharge.  °· A fish-like odor with discharge, especially after sexual intercourse.  °· Itching or burning of the vagina and vulva.  °· Burning or pain with urination.  °· Some women have no signs or symptoms at all.  °DIAGNOSIS  °Your caregiver must examine the vagina for signs of BV. Your caregiver will perform lab tests and look at the sample of vaginal fluid through a microscope. They will look for bacteria and abnormal   cells (clue cells), a pH test higher than 4.5, and a positive amine test all associated with BV.  °RISKS AND COMPLICATIONS  °· Pelvic inflammatory disease (PID).  °· Infections following gynecology surgery.  °· Developing HIV.  °· Developing herpes virus.  °TREATMENT  °Sometimes BV will clear up without treatment. However, all women with symptoms of BV should be treated to avoid complications, especially if gynecology surgery is planned. Female partners generally do not need to be treated. However, BV may spread between female sex partners so treatment is helpful in preventing a recurrence of BV.  °· BV may be treated with antibiotics. The antibiotics come in either pill or vaginal cream forms. Either can be used with nonpregnant or pregnant women, but the recommended dosages differ. These antibiotics are not harmful to the baby.  °· BV can recur after treatment. If this happens, a second round of antibiotics will often be prescribed.  °· Treatment is important for pregnant women. If not treated, BV can cause a premature delivery, especially for a pregnant woman who had a premature birth in the past. All pregnant women who have symptoms of BV should be checked and treated.  °· For chronic reoccurrence of BV, treatment with a type of prescribed gel vaginally twice a week is helpful.  °HOME CARE INSTRUCTIONS  °· Finish all medication as  directed by your caregiver.  °· Do not have sex until treatment is completed.  °· Tell your sexual partner that you have a vaginal infection. They should see their caregiver and be treated if they have problems, such as a mild rash or itching.  °· Practice safe sex. Use condoms. Only have 1 sex partner.  °PREVENTION  °Basic prevention steps can help reduce the risk of upsetting the natural balance of bacteria in the vagina and developing BV: °· Do not have sexual intercourse (be abstinent).  °· Do not douche.  °· Use all of the medicine prescribed for treatment of BV, even if the signs and symptoms go away.  °· Tell your sex partner if you have BV. That way, they can be treated, if needed, to prevent reoccurrence.  °SEEK MEDICAL CARE IF:  °· Your symptoms are not improving after 3 days of treatment.  °· You have increased discharge, pain, or fever.  °MAKE SURE YOU:  °· Understand these instructions.  °· Will watch your condition.  °· Will get help right away if you are not doing well or get worse.  °FOR MORE INFORMATION  °Division of STD Prevention (DSTDP), Centers for Disease Control and Prevention: www.cdc.gov/std °American Social Health Association (ASHA): www.ashastd.org  °Document Released: 08/13/2005 Document Revised: 08/02/2011 Document Reviewed: 02/03/2009 °ExitCare® Patient Information ©2012 [employer]. °

## 2011-12-23 NOTE — MAU Note (Signed)
Patient states she has been having breast pain x4 days and right sided abdominal pain. LMP 11/26/11. No abnormal vaginal bleeding.

## 2011-12-24 LAB — GC/CHLAMYDIA PROBE AMP, GENITAL
Chlamydia, DNA Probe: NEGATIVE
GC Probe Amp, Genital: NEGATIVE

## 2011-12-31 NOTE — MAU Provider Note (Signed)
Chart reviewed and agree with management and plan.  

## 2012-02-07 ENCOUNTER — Emergency Department (HOSPITAL_COMMUNITY)
Admission: EM | Admit: 2012-02-07 | Discharge: 2012-02-08 | Discharge status: Against Medical Advice | Payer: Self-pay | Attending: Emergency Medicine | Admitting: Emergency Medicine

## 2012-02-07 ENCOUNTER — Encounter (HOSPITAL_COMMUNITY): Payer: Self-pay

## 2012-02-07 DIAGNOSIS — M25519 Pain in unspecified shoulder: Secondary | ICD-10-CM | POA: Insufficient documentation

## 2012-02-07 NOTE — ED Notes (Signed)
Co right shoulder pain for one week denies injury.

## 2012-02-13 ENCOUNTER — Emergency Department (INDEPENDENT_AMBULATORY_CARE_PROVIDER_SITE_OTHER)
Admission: EM | Admit: 2012-02-13 | Discharge: 2012-02-13 | Disposition: A | Discharge status: Home / Self Care | Payer: Self-pay | Source: Home / Self Care | Attending: Emergency Medicine | Admitting: Emergency Medicine

## 2012-02-13 ENCOUNTER — Encounter (HOSPITAL_COMMUNITY): Payer: Self-pay | Admitting: Emergency Medicine

## 2012-02-13 DIAGNOSIS — J329 Chronic sinusitis, unspecified: Secondary | ICD-10-CM

## 2012-02-13 MED ORDER — ACETAMINOPHEN-CODEINE #3 300-30 MG PO TABS: 1.0000 | ORAL_TABLET | Freq: Four times a day (QID) | ORAL | Status: AC | PRN

## 2012-02-13 MED ORDER — AMOXICILLIN-POT CLAVULANATE 500-125 MG PO TABS: 1.0000 | ORAL_TABLET | Freq: Two times a day (BID) | ORAL | Status: AC

## 2012-02-13 MED ORDER — FEXOFENADINE-PSEUDOEPHED ER 60-120 MG PO TB12: 1.0000 | ORAL_TABLET | Freq: Two times a day (BID) | ORAL | Status: DC

## 2012-02-13 NOTE — ED Notes (Signed)
Onset one week.  Forehead pain, pressure, upper teeth hurt.  Has been treating symptoms with over the counter medicines and measures, not improving.

## 2012-02-13 NOTE — Discharge Instructions (Signed)

## 2012-02-13 NOTE — ED Provider Notes (Signed)
History     CSN: [id number]  Arrival date & time 02/13/12  1357   First MD Initiated Contact with Patient 02/13/12 1508      Chief Complaint  Patient presents with  . URI    (Consider location/radiation/quality/duration/timing/severity/associated sxs/prior treatment) HPI Comments: Patient presents urgent care today complaining of ongoing worsening sinus pressure (points towards maxillary sinuses region bilaterally). She describes it about 9 or 10 days ago she was congested was uncertain if she had a cold or was allergies that somewhat subsided and since then has been experiencing pressure on both sinuses, its worsening now also been expressing upper gum discomfort and her upper teeth hurt. She has been using a 90 pot and taking Sudafed frequently but symptoms are not improving. Patient denies any fevers, dizziness or further neurological symptoms. She describes a constant feeling of pressure both of her years prep predominantly the left one. Denies any cough, shortness of breath or fevers.  Patient is a 41 y.o. female presenting with URI. The history is provided by the patient.  URI The primary symptoms include headaches and ear pain. Primary symptoms do not include fever, sore throat, swollen glands, wheezing, abdominal pain, vomiting, arthralgias or rash. The current episode started today.  The headache is not associated with neck stiffness or weakness.   Symptoms associated with the illness include facial pain, sinus pressure and congestion. The illness is not associated with chills, plugged ear sensation or rhinorrhea.    Past Medical History  Diagnosis Date  . Migraines   . Asthma   . Hypertension     History reviewed. No pertinent past surgical history.  Family History  Problem Relation Age of Onset  . Diabetes Father   . Heart attack Father   . Hypertension Father   . Cancer Father   . Diabetes Mother   . Hypertension Mother     History  Substance Use Topics  .  Smoking status: Current Everyday Smoker -- 0.5 packs/day    Types: Cigarettes  . Smokeless tobacco: Not on file  . Alcohol Use: No    OB History    Grav Para Term Preterm Abortions TAB SAB Ect Mult Living   0 0 0 0 0 0 0 0 0 0       Review of Systems  Constitutional: Negative for fever, chills, activity change and appetite change.  HENT: Positive for ear pain, congestion and sinus pressure. Negative for hearing loss, nosebleeds, sore throat, facial swelling, rhinorrhea, drooling, neck pain, neck stiffness, dental problem and tinnitus.   Eyes: Negative for discharge and visual disturbance.  Respiratory: Negative for wheezing.   Gastrointestinal: Negative for vomiting and abdominal pain.  Musculoskeletal: Negative for arthralgias.  Skin: Negative for rash.  Neurological: Positive for headaches. Negative for dizziness, weakness and numbness.    Allergies  Review of patient's allergies indicates no known allergies.  Home Medications   Current Outpatient Rx  Name Route Sig Dispense Refill  . ALBUTEROL IN Inhalation Inhale into the lungs.    . MELOXICAM 15 MG PO TABS Oral Take 15 mg by mouth daily.    . ACETAMINOPHEN-CODEINE #3 300-30 MG PO TABS Oral Take 1-2 tablets by mouth every 6 (six) hours as needed for pain. 15 tablet 0  . AZOR PO Oral Take 5 mg by mouth daily.    . AMOXICILLIN-POT CLAVULANATE 500-125 MG PO TABS Oral Take 1 tablet (500 mg total) by mouth 2 (two) times daily. 20 tablet 0  . FEXOFENADINE-PSEUDOEPHED ER 60-120 MG  PO TB12 Oral Take 1 tablet by mouth every 12 (twelve) hours. 14 tablet 0  . METOCLOPRAMIDE HCL 5 MG PO TABS Oral Take 5 mg by mouth 4 (four) times daily as needed.      . TOPIRAMATE 100 MG PO TABS Oral Take 100 mg by mouth daily.        BP 145/82  Pulse 76  Temp 98.5 F (36.9 C) (Oral)  Resp 18  SpO2 100%  LMP 01/29/2012  Physical Exam  Nursing note and vitals reviewed. Constitutional: She appears well-developed and well-nourished.  Non-toxic  appearance. She does not have a sickly appearance. She does not appear ill. No distress.  HENT:  Head: Normocephalic.  Mouth/Throat: Uvula is midline and mucous membranes are normal. No oropharyngeal exudate or posterior oropharyngeal erythema.  Eyes: Conjunctivae are normal. Right eye exhibits no discharge. Left eye exhibits no discharge. No scleral icterus.  Pulmonary/Chest: Effort normal and breath sounds normal. No respiratory distress. She has no decreased breath sounds. She has no wheezes. She has no rhonchi. She has no rales. She exhibits no tenderness.  Skin: No rash noted. No erythema.    ED Course  Procedures (including critical care time)  Labs Reviewed - No data to display No results found.   1. Sinusitis       MDM  Predominantly maxillary sinusitis bilaterally. Patient afebrile. Patient had been trying with decongestions and have been symptomatic for a week and symptoms are exacerbating and worsening. Will start patient on a formal regimen of Allegra-D for 14 days along with an antibiotic cycle per was encouraged to followup with her primary care Dr. if symptoms were to persist.        Jimmie Molly, MD 02/13/12 1556

## 2012-08-08 ENCOUNTER — Encounter: Payer: Self-pay | Admitting: Internal Medicine

## 2012-08-08 ENCOUNTER — Ambulatory Visit (INDEPENDENT_AMBULATORY_CARE_PROVIDER_SITE_OTHER): Payer: Self-pay | Admitting: Internal Medicine

## 2012-08-08 ENCOUNTER — Other Ambulatory Visit (INDEPENDENT_AMBULATORY_CARE_PROVIDER_SITE_OTHER): Payer: Self-pay

## 2012-08-08 ENCOUNTER — Other Ambulatory Visit: Payer: Self-pay | Admitting: Internal Medicine

## 2012-08-08 VITALS — BP 140/94 | HR 83 | Temp 97.5°F | Ht 64.0 in | Wt 179.0 lb

## 2012-08-08 DIAGNOSIS — Z1321 Encounter for screening for nutritional disorder: Secondary | ICD-10-CM

## 2012-08-08 DIAGNOSIS — Z13220 Encounter for screening for lipoid disorders: Secondary | ICD-10-CM

## 2012-08-08 DIAGNOSIS — Z130 Encounter for screening for diseases of the blood and blood-forming organs and certain disorders involving the immune mechanism: Secondary | ICD-10-CM

## 2012-08-08 DIAGNOSIS — Z1331 Encounter for screening for depression: Secondary | ICD-10-CM

## 2012-08-08 DIAGNOSIS — Z0000 Encounter for general adult medical examination without abnormal findings: Secondary | ICD-10-CM

## 2012-08-08 DIAGNOSIS — Z1329 Encounter for screening for other suspected endocrine disorder: Secondary | ICD-10-CM

## 2012-08-08 DIAGNOSIS — Z131 Encounter for screening for diabetes mellitus: Secondary | ICD-10-CM

## 2012-08-08 DIAGNOSIS — Z23 Encounter for immunization: Secondary | ICD-10-CM

## 2012-08-08 LAB — LIPID PANEL
HDL: 34.9 mg/dL — ABNORMAL LOW (ref >39.00)
Total CHOL/HDL Ratio: 6
Triglycerides: 53 mg/dL (ref 0.0–149.0)

## 2012-08-08 LAB — CBC
HCT: 41 % (ref 36.0–46.0)
RDW: 14.2 % (ref 11.5–14.6)
WBC: 5.8 10*3/uL (ref 4.5–10.5)

## 2012-08-08 LAB — BASIC METABOLIC PANEL
CO2: 27 mEq/L (ref 19–32)
Calcium: 9.1 mg/dL (ref 8.4–10.5)
Potassium: 3.9 mEq/L (ref 3.5–5.1)
Sodium: 136 mEq/L (ref 135–145)

## 2012-08-08 MED ORDER — TOPIRAMATE 200 MG PO TABS: 200.0000 mg | ORAL_TABLET | Freq: Two times a day (BID) | ORAL | Status: DC

## 2012-08-08 MED ORDER — FLUOXETINE HCL 20 MG PO TABS: 20.0000 mg | ORAL_TABLET | Freq: Every day | ORAL | Status: DC

## 2012-08-08 MED ORDER — ALPRAZOLAM 0.5 MG PO TABS: 0.5000 mg | ORAL_TABLET | Freq: Every evening | ORAL | Status: AC | PRN

## 2012-08-08 MED ORDER — AMLODIPINE BESYLATE 10 MG PO TABS: 10.0000 mg | ORAL_TABLET | Freq: Every day | ORAL | Status: DC

## 2012-08-08 NOTE — Patient Instructions (Signed)

## 2012-08-08 NOTE — Progress Notes (Signed)
HPI  Pt presents to the clinic today to establish care. She has not seen a primary care doctor in a number of years. She does have a number of concerns today. 1- she has high blood pressure, was on azor but self d/c'd because it made her feel "wierd". She has not taken blood pressure medicine in months. 2- She is experiencing generalized anxiety with panic attacks. She has had this for a number of years but has never been treated for it. Her last panic attack was 2 weeks ago. She feels dizzy, lightheaded, she has difficulty breathing and feels like her heart races during these episodes. There are no specific triggers other than stress. 3- she has a long standing history of migraines. She was on topamax until she ran out a few months ago. The topamax works really well for her. She would like a refill today.  LMP: 07/2012 Pap smear:  2011 Flu: 2012 Tetanus: greater than 10 years  Past Medical History  Diagnosis Date  . Migraines   . Asthma   . Hypertension     Current Outpatient Prescriptions  Medication Sig Dispense Refill  . ALPRAZolam (XANAX) 0.5 MG tablet Take 1 tablet (0.5 mg total) by mouth at bedtime as needed for sleep.  30 tablet  0  . amLODipine (NORVASC) 10 MG tablet Take 1 tablet (10 mg total) by mouth daily.  90 tablet  3  . FLUoxetine (PROZAC) 20 MG tablet Take 1 tablet (20 mg total) by mouth daily.  30 tablet  3  . topiramate (TOPAMAX) 200 MG tablet Take 1 tablet (200 mg total) by mouth 2 (two) times daily.  60 tablet  2    No Known Allergies  Family History  Problem Relation Age of Onset  . Diabetes Father   . Heart attack Father   . Hypertension Father   . Cancer Father     Colon Cancer  . Hyperlipidemia Father   . Stroke Father   . Diabetes Mother   . Hypertension Mother   . Hyperlipidemia Mother   . Diabetes Sister   . Diabetes Other     History   Social History  . Marital Status: Married    Spouse Name: N/A    Number of Children: N/A  . Years of  Education: 12   Occupational History  . unemployed    Social History Main Topics  . Smoking status: Current Every Day Smoker -- 0.2 packs/day for 10 years    Types: Cigarettes  . Smokeless tobacco: Never Used  . Alcohol Use: No  . Drug Use: No  . Sexually Active: Yes   Other Topics Concern  . Not on file   Social History Narrative  . No narrative on file    ROS:  Constitutional: Denies fever, malaise, fatigue, headache or abrupt weight changes.  HEENT: Denies eye pain, eye redness, ear pain, ringing in the ears, wax buildup, runny nose, nasal congestion, bloody nose, or sore throat. Respiratory: Denies difficulty breathing, shortness of breath, cough or sputum production.   Cardiovascular: Denies chest pain, chest tightness, palpitations or swelling in the hands or feet.  Gastrointestinal: Denies abdominal pain, bloating, constipation, diarrhea or blood in the stool.  GU: Denies frequency, urgency, pain with urination, blood in urine, odor or discharge. Musculoskeletal: Denies decrease in range of motion, difficulty with gait, muscle pain or joint pain and swelling.  Skin: Denies redness, rashes, lesions or ulcercations.  Neurological: Denies dizziness, difficulty with memory, difficulty with speech or  problems with balance and coordination.   No other specific complaints in a complete review of systems (except as listed in HPI above).  PE:  BP 140/94  Pulse 83  Temp 97.5 F (36.4 C) (Oral)  Ht 5\' 4"  (1.626 m)  Wt 179 lb (81.194 kg)  BMI 30.73 kg/m2  SpO2 99%  LMP 08/06/2012 Wt Readings from Last 3 Encounters:  08/08/12 179 lb (81.194 kg)  02/07/12 182 lb (82.555 kg)  12/23/11 184 lb (83.462 kg)    General: Appears her stated age, overweight but well developed, well nourished in NAD. HEENT: Head: normal shape and size; Eyes: sclera white, no icterus, conjunctiva pink, PERRLA and EOMs intact; Ears: Tm's gray and intact, normal light reflex; Nose: mucosa pink and  moist, septum midline; Throat/Mouth: Teeth present, mucosa pink and moist, no lesions or ulcerations noted.  Neck: Normal range of motion. Neck supple, trachea midline. No massses, lumps or thyromegaly present.  Cardiovascular: Normal rate and rhythm. S1,S2 noted.  No murmur, rubs or gallops noted. No JVD or BLE edema. No carotid bruits noted. Pulmonary/Chest: Normal effort and positive vesicular breath sounds. No respiratory distress. No wheezes, rales or ronchi noted.  Abdomen: Soft and nontender. Normal bowel sounds, no bruits noted. No distention or masses noted. Liver, spleen and kidneys non palpable. Musculoskeletal: Normal range of motion. No signs of joint swelling. No difficulty with gait.  Neurological: Alert and oriented. Cranial nerves II-XII intact. Coordination normal. +DTRs bilaterally. Psychiatric: Mood and affect normal. Behavior is normal. Judgment and thought content normal.    Assessment and Plan:  Preventative Health Maintenance:  Start a diet and exercise program Will obtain basic screening labs today Will give flu and Tdap  Hypertension:  Consume a low salt diet Start amlodipine 10 mg Check blood pressure a couple of times per week  Anxiety, new onset with additional workup required:  Prozac 20 mg daily Xanax 0.5 mg as needed for panic attacks May benefit from cognitive behavioral therapy  Migraines:  Refill Topamax  RTC in 1 month for blood pressure check

## 2012-08-09 ENCOUNTER — Other Ambulatory Visit: Payer: Self-pay | Admitting: Internal Medicine

## 2012-08-09 MED ORDER — ERGOCALCIFEROL 1.25 MG (50000 UT) PO CAPS: 50000.0000 [IU] | ORAL_CAPSULE | ORAL | Status: DC

## 2012-08-11 ENCOUNTER — Telehealth: Payer: Self-pay | Admitting: *Deleted

## 2012-08-11 NOTE — Telephone Encounter (Signed)
Pt informed of lab results and of NP's advisement.    Ash,  Can you please send this and call Mrs. Stanforth and let her know that her glucose and your HgbA1C are slightly elevated. This indicates that you are prediabetic and have an increased risk of developing diabetes in the next 10 years. You should avoid sugar and carbohydrates in your diet. For more information on nutritional changes, go to [email or web address].[email or web address]  Your cholesterol is elevated. I suggest 3 months of lifestyle changes in cluding avoiding fried foods and fast food in combination with at least 30 minutes of exercise 4 days out of the week. You should return for an OV in 3 months to reevaluate this. Thx!  Rene Kocher

## 2012-08-12 ENCOUNTER — Other Ambulatory Visit: Payer: Self-pay | Admitting: Internal Medicine

## 2012-08-12 ENCOUNTER — Telehealth: Payer: Self-pay | Admitting: *Deleted

## 2012-08-12 DIAGNOSIS — F329 Major depressive disorder, single episode, unspecified: Secondary | ICD-10-CM

## 2012-08-12 DIAGNOSIS — I10 Essential (primary) hypertension: Secondary | ICD-10-CM

## 2012-08-12 MED ORDER — LISINOPRIL 10 MG PO TABS: 10.0000 mg | ORAL_TABLET | Freq: Every day | ORAL | Status: DC

## 2012-08-12 MED ORDER — FLUOXETINE HCL (PMDD) 10 MG PO CAPS: 10.0000 mg | ORAL_CAPSULE | Freq: Every morning | ORAL | Status: DC

## 2012-08-12 NOTE — Telephone Encounter (Signed)
Pt requesting MD review medications and change because she cannot afford them. She would like them to be changed to alternatives on [employer].

## 2012-08-12 NOTE — Telephone Encounter (Signed)
Ash, I have changed the Norvasc to Lisinopril 10 mg daily The prozac 20 mg capsules are on the 4 dollar list The xanax and migraine meds are not covered on the 4 dollar list

## 2012-08-13 NOTE — Telephone Encounter (Signed)
Pt informed of new medications and NP's advisement.

## 2012-09-08 ENCOUNTER — Ambulatory Visit: Payer: Self-pay | Admitting: Internal Medicine

## 2012-09-12 ENCOUNTER — Ambulatory Visit: Payer: Self-pay | Admitting: Internal Medicine

## 2012-09-12 DIAGNOSIS — Z0289 Encounter for other administrative examinations: Secondary | ICD-10-CM

## 2013-05-03 ENCOUNTER — Encounter (HOSPITAL_BASED_OUTPATIENT_CLINIC_OR_DEPARTMENT_OTHER): Payer: Self-pay | Admitting: Emergency Medicine

## 2013-05-03 ENCOUNTER — Emergency Department (HOSPITAL_BASED_OUTPATIENT_CLINIC_OR_DEPARTMENT_OTHER)
Admission: EM | Admit: 2013-05-03 | Discharge: 2013-05-03 | Disposition: A | Discharge status: Home / Self Care | Payer: Self-pay | Attending: Emergency Medicine | Admitting: Emergency Medicine

## 2013-05-03 ENCOUNTER — Emergency Department (HOSPITAL_BASED_OUTPATIENT_CLINIC_OR_DEPARTMENT_OTHER): Payer: Self-pay

## 2013-05-03 DIAGNOSIS — F17200 Nicotine dependence, unspecified, uncomplicated: Secondary | ICD-10-CM | POA: Insufficient documentation

## 2013-05-03 DIAGNOSIS — X500XXA Overexertion from strenuous movement or load, initial encounter: Secondary | ICD-10-CM | POA: Insufficient documentation

## 2013-05-03 DIAGNOSIS — Y9289 Other specified places as the place of occurrence of the external cause: Secondary | ICD-10-CM | POA: Insufficient documentation

## 2013-05-03 DIAGNOSIS — M79672 Pain in left foot: Secondary | ICD-10-CM

## 2013-05-03 DIAGNOSIS — Z79899 Other long term (current) drug therapy: Secondary | ICD-10-CM | POA: Insufficient documentation

## 2013-05-03 DIAGNOSIS — Z3202 Encounter for pregnancy test, result negative: Secondary | ICD-10-CM | POA: Insufficient documentation

## 2013-05-03 DIAGNOSIS — G43909 Migraine, unspecified, not intractable, without status migrainosus: Secondary | ICD-10-CM | POA: Insufficient documentation

## 2013-05-03 DIAGNOSIS — I10 Essential (primary) hypertension: Secondary | ICD-10-CM | POA: Insufficient documentation

## 2013-05-03 DIAGNOSIS — S8990XA Unspecified injury of unspecified lower leg, initial encounter: Secondary | ICD-10-CM | POA: Insufficient documentation

## 2013-05-03 DIAGNOSIS — Y9339 Activity, other involving climbing, rappelling and jumping off: Secondary | ICD-10-CM | POA: Insufficient documentation

## 2013-05-03 DIAGNOSIS — Z791 Long term (current) use of non-steroidal anti-inflammatories (NSAID): Secondary | ICD-10-CM | POA: Insufficient documentation

## 2013-05-03 DIAGNOSIS — J45909 Unspecified asthma, uncomplicated: Secondary | ICD-10-CM | POA: Insufficient documentation

## 2013-05-03 LAB — PREGNANCY, URINE: Preg Test, Ur: NEGATIVE

## 2013-05-03 MED ORDER — MELOXICAM 7.5 MG PO TABS: 15.0000 mg | ORAL_TABLET | Freq: Every day | ORAL | Status: DC

## 2013-05-03 NOTE — ED Notes (Signed)
Left ankle injury on Friday.  Pt has been using OTC remedies without relief.  No deformities, pulses good.

## 2013-05-03 NOTE — ED Provider Notes (Signed)
CSN: [id number]     Arrival date & time 05/03/13  1540 History   First MD Initiated Contact with Patient 05/03/13 1824     Chief Complaint  Patient presents with  . Ankle Injury   (Consider location/radiation/quality/duration/timing/severity/associated sxs/prior Treatment) HPI Comments: Patient presents for left ankle and foot pain after jumping off a box 3 days ago in her storage unit. Patient denies inability to ambulate, pallor, numbness or tingling, and weakness in her left lower extremity and foot.  Patient is a 42 y.o. female presenting with lower extremity injury. The history is provided by the patient. No language interpreter was used.  Ankle Injury This is a new problem. Episode onset: 3 days ago. The problem occurs constantly. The problem has been unchanged. Associated symptoms include arthralgias, joint swelling and myalgias. Pertinent negatives include no fever, numbness or weakness. The symptoms are aggravated by walking, bending and standing. She has tried ice, NSAIDs, acetaminophen and heat for the symptoms. The treatment provided moderate relief.    Past Medical History  Diagnosis Date  . Migraines   . Asthma   . Hypertension    History reviewed. No pertinent past surgical history. Family History  Problem Relation Age of Onset  . Diabetes Father   . Heart attack Father   . Hypertension Father   . Cancer Father     Colon Cancer  . Hyperlipidemia Father   . Stroke Father   . Diabetes Mother   . Hypertension Mother   . Hyperlipidemia Mother   . Diabetes Sister   . Diabetes Other    History  Substance Use Topics  . Smoking status: Current Every Day Smoker -- 0.25 packs/day for 10 years    Types: Cigarettes  . Smokeless tobacco: Never Used  . Alcohol Use: No   OB History   Grav Para Term Preterm Abortions TAB SAB Ect Mult Living   0 0 0 0 0 0 0 0 0 0      Review of Systems  Constitutional: Negative for fever.  Musculoskeletal: Positive for myalgias, joint  swelling and arthralgias.  Skin: Negative for color change and pallor.  Neurological: Negative for weakness and numbness.  All other systems reviewed and are negative.   Allergies  Vicodin  Home Medications   Current Outpatient Rx  Name  Route  Sig  Dispense  Refill  . QUEtiapine (SEROQUEL) 25 MG tablet   Oral   Take 25 mg by mouth as needed.         . ALPRAZolam (XANAX) 0.5 MG tablet   Oral   Take 1 tablet (0.5 mg total) by mouth at bedtime as needed for sleep.   30 tablet   0   . ergocalciferol (VITAMIN D2) 50000 UNITS capsule   Oral   Take 1 capsule (50,000 Units total) by mouth once a week.   12 capsule   0   . Fluoxetine HCl, PMDD, 10 MG CAPS   Oral   Take 1 capsule (10 mg total) by mouth every morning.   60 each   0   . lisinopril (PRINIVIL,ZESTRIL) 10 MG tablet   Oral   Take 1 tablet (10 mg total) by mouth daily.   90 tablet   3   . meloxicam (MOBIC) 7.5 MG tablet   Oral   Take 2 tablets (15 mg total) by mouth daily.   30 tablet   0   . topiramate (TOPAMAX) 200 MG tablet   Oral   Take 1 tablet (200  mg total) by mouth 2 (two) times daily.   60 tablet   2    BP 141/87  Pulse 88  Temp(Src) 98.2 F (36.8 C) (Oral)  Resp 16  Ht 5\' 4"  (1.626 m)  Wt 184 lb (83.462 kg)  BMI 31.57 kg/m2  SpO2 100%  LMP 04/06/2013  Physical Exam  Nursing note and vitals reviewed. Constitutional: She is oriented to person, place, and time. She appears well-developed and well-nourished. No distress.  HENT:  Head: Normocephalic and atraumatic.  Eyes: Conjunctivae and EOM are normal. No scleral icterus.  Neck: Normal range of motion.  Cardiovascular: Normal rate, regular rhythm and intact distal pulses.   DP and PT pulses 2+ bilaterally. Capillary refill normal.  Pulmonary/Chest: Effort normal. No respiratory distress.  Musculoskeletal: Normal range of motion.       Left foot: She exhibits tenderness, bony tenderness and swelling (Very mild). She exhibits normal  range of motion, normal capillary refill, no crepitus, no deformity and no laceration.       Feet:  Neurological: She is alert and oriented to person, place, and time. She has normal reflexes.  No gross sensory deficits appreciated. DTRs normal and symmetric.  Skin: Skin is warm and dry. No rash noted. She is not diaphoretic. No erythema. No pallor.  Psychiatric: She has a normal mood and affect. Her behavior is normal.    ED Course  Procedures (including critical care time) Labs Review Labs Reviewed  PREGNANCY, URINE   Imaging Review Dg Ankle Complete Left  05/03/2013   *RADIOLOGY REPORT*  Clinical Data: Finger injury  LEFT ANKLE COMPLETE - 3+ VIEW  Comparison: None  Findings: There is no evidence of fracture or dislocation.  There is no evidence of arthropathy or other focal bone abnormality. Soft tissues are unremarkable.  IMPRESSION: Negative exam.   Original Report Authenticated By: Signa Kell, M.D.    MDM   1. Foot pain, left    Left foot pain secondary to jumping off a box 3 days ago. Patient is neurovascularly intact, hemodynamically stable, and afebrile. Physical exam findings as above. There is no erythema or heat-to-touch to suspect underlying cellulitis or infectious type process. No pallor, pulselessness, poikilothermia, or paresthesias appreciated. X-ray without evidence of fracture or dislocation. ASO ankle applied in ED for stability. RICE instruction provided as well as orthopedic referral should symptoms not improve with recommended treatment. Return precautions advised and patient agreeable to plan. Patient stable for discharge.    Antony Madura, PA-C 05/03/13 [id number]

## 2013-05-04 NOTE — ED Provider Notes (Signed)
Medical screening examination/treatment/procedure(s) were performed by non-physician practitioner and as supervising physician I was immediately available for consultation/collaboration.  Doug Sou, MD 05/04/13 0000

## 2013-08-18 ENCOUNTER — Emergency Department (HOSPITAL_COMMUNITY)
Admission: EM | Admit: 2013-08-18 | Discharge: 2013-08-19 | Discharge status: Against Medical Advice | Payer: Self-pay | Attending: Emergency Medicine | Admitting: Emergency Medicine

## 2013-08-18 ENCOUNTER — Encounter (HOSPITAL_COMMUNITY): Payer: Self-pay | Admitting: Emergency Medicine

## 2013-08-18 DIAGNOSIS — J45909 Unspecified asthma, uncomplicated: Secondary | ICD-10-CM | POA: Insufficient documentation

## 2013-08-18 DIAGNOSIS — G43909 Migraine, unspecified, not intractable, without status migrainosus: Secondary | ICD-10-CM | POA: Insufficient documentation

## 2013-08-18 DIAGNOSIS — I10 Essential (primary) hypertension: Secondary | ICD-10-CM | POA: Insufficient documentation

## 2013-08-18 DIAGNOSIS — F17200 Nicotine dependence, unspecified, uncomplicated: Secondary | ICD-10-CM | POA: Insufficient documentation

## 2013-08-18 NOTE — ED Notes (Signed)
Patient is alert and oriented x3.  She is complaining of a migraine that started today about 12pm.  She states that she has a history of migraines.  Currently she rates her pain 6 of 10 with photophobia  And nausea with vomiting.

## 2013-11-16 ENCOUNTER — Encounter (HOSPITAL_COMMUNITY): Payer: Self-pay | Admitting: Emergency Medicine

## 2013-11-16 ENCOUNTER — Emergency Department (HOSPITAL_COMMUNITY)
Admission: EM | Admit: 2013-11-16 | Discharge: 2013-11-16 | Disposition: A | Discharge status: Home / Self Care | Payer: Self-pay | Attending: Emergency Medicine | Admitting: Emergency Medicine

## 2013-11-16 DIAGNOSIS — J309 Allergic rhinitis, unspecified: Secondary | ICD-10-CM | POA: Insufficient documentation

## 2013-11-16 DIAGNOSIS — Z79899 Other long term (current) drug therapy: Secondary | ICD-10-CM | POA: Insufficient documentation

## 2013-11-16 DIAGNOSIS — J45901 Unspecified asthma with (acute) exacerbation: Secondary | ICD-10-CM | POA: Insufficient documentation

## 2013-11-16 DIAGNOSIS — J302 Other seasonal allergic rhinitis: Secondary | ICD-10-CM

## 2013-11-16 DIAGNOSIS — Z791 Long term (current) use of non-steroidal anti-inflammatories (NSAID): Secondary | ICD-10-CM | POA: Insufficient documentation

## 2013-11-16 DIAGNOSIS — I10 Essential (primary) hypertension: Secondary | ICD-10-CM | POA: Insufficient documentation

## 2013-11-16 DIAGNOSIS — R51 Headache: Secondary | ICD-10-CM

## 2013-11-16 DIAGNOSIS — R519 Headache, unspecified: Secondary | ICD-10-CM

## 2013-11-16 DIAGNOSIS — F17200 Nicotine dependence, unspecified, uncomplicated: Secondary | ICD-10-CM | POA: Insufficient documentation

## 2013-11-16 DIAGNOSIS — G43909 Migraine, unspecified, not intractable, without status migrainosus: Secondary | ICD-10-CM | POA: Insufficient documentation

## 2013-11-16 DIAGNOSIS — R6884 Jaw pain: Secondary | ICD-10-CM | POA: Insufficient documentation

## 2013-11-16 MED ORDER — FLUTICASONE PROPIONATE 50 MCG/ACT NA SUSP: 2.0000 | Freq: Every day | NASAL | Status: DC

## 2013-11-16 MED ORDER — GUAIFENESIN ER 600 MG PO TB12: 600.0000 mg | ORAL_TABLET | Freq: Two times a day (BID) | ORAL | Status: DC

## 2013-11-16 NOTE — ED Provider Notes (Signed)
CSN: [id number]     Arrival date & time 11/16/13  1950 History  This chart was scribed for non-physician practitioner, Ivonne AndrewPeter Jahmiyah Dullea, PA-C working with Nelia Shiobert L Beaton, MD by Luisa DagoPriscilla Tutu, ED scribe. This patient was seen in room WTR8/WTR8 and the patient's care was started at 10:30 PM.    Chief Complaint  Patient presents with  . Headache  . Shortness of Breath    The history is provided by the patient. No language interpreter was used.   HPI Comments: Cindy Peters is a 43 y.o. female who presents to the Emergency Department complaining of headache that started 2 days ago. Pt is also complaining of associated rhinorrhea, sneezing, SOB, jaw pain, and watery eyes. She reports taking Claritin and Benadryl with no relief. Pt states that she has a history of allergy. Her symptoms started with watery eyes. She also has a history of asthma. Pt states that her baby was diagnosed with bronchiolitis a week ago. Deneis any fever, chills, myalgias, nausea, or emesis.     Past Medical History  Diagnosis Date  . Migraines   . Asthma   . Hypertension    History reviewed. No pertinent past surgical history. Family History  Problem Relation Age of Onset  . Diabetes Father   . Heart attack Father   . Hypertension Father   . Cancer Father     Colon Cancer  . Hyperlipidemia Father   . Stroke Father   . Diabetes Mother   . Hypertension Mother   . Hyperlipidemia Mother   . Diabetes Sister   . Diabetes Other    History  Substance Use Topics  . Smoking status: Current Every Day Smoker -- 0.25 packs/day for 10 years    Types: Cigarettes  . Smokeless tobacco: Never Used  . Alcohol Use: No   OB History   Grav Para Term Preterm Abortions TAB SAB Ect Mult Living   0 0 0 0 0 0 0 0 0 0      Review of Systems  Constitutional: Negative for chills.  HENT: Positive for rhinorrhea.   Eyes: Positive for discharge (watery eyes).  Respiratory: Positive for shortness of breath.    Gastrointestinal: Negative for nausea, vomiting and abdominal pain.  Musculoskeletal: Positive for arthralgias (jaw pain).      Allergies  Vicodin  Home Medications   Current Outpatient Rx  Name  Route  Sig  Dispense  Refill  . ALPRAZolam (XANAX) 0.5 MG tablet   Oral   Take 1 tablet (0.5 mg total) by mouth at bedtime as needed for sleep.   30 tablet   0   . Fluoxetine HCl, PMDD, 10 MG CAPS   Oral   Take 1 capsule (10 mg total) by mouth every morning.   60 each   0   . meloxicam (MOBIC) 7.5 MG tablet   Oral   Take 2 tablets (15 mg total) by mouth daily.   30 tablet   0   . QUEtiapine (SEROQUEL) 25 MG tablet   Oral   Take 25 mg by mouth as needed (migraines).          . topiramate (TOPAMAX) 200 MG tablet   Oral   Take 1 tablet (200 mg total) by mouth 2 (two) times daily.   60 tablet   2    BP 157/100  Pulse 87  Temp(Src) 98.4 F (36.9 C) (Oral)  Resp 18  SpO2 100%  LMP 10/17/2013  Physical Exam  Nursing note and  vitals reviewed. Constitutional: She is oriented to person, place, and time. She appears well-developed and well-nourished.  HENT:  Head: Normocephalic and atraumatic.  Right Ear: Tympanic membrane and external ear normal.  Left Ear: Tympanic membrane and external ear normal.  Nasal mucosa edema and discharge.  Eyes: EOM are normal. Pupils are equal, round, and reactive to light.  Neck: Normal range of motion. Neck supple.  No meningeal signs  Cardiovascular: Normal rate.   Pulmonary/Chest: Effort normal and breath sounds normal. No respiratory distress. She has no wheezes. She has no rales.  Abdominal: She exhibits no distension.  Musculoskeletal: Normal range of motion.  Lymphadenopathy:    She has no cervical adenopathy.  Neurological: She is alert and oriented to person, place, and time.  Skin: Skin is warm and dry.  Psychiatric: She has a normal mood and affect.    ED Course  Procedures   DIAGNOSTIC STUDIES: Oxygen Saturation  is 100% on RA, normal by my interpretation.    COORDINATION OF CARE: 10:32 PM- Will prescribe medication to relieve her symptoms. Advised pt to drink a lot of water to aid in the clearing of her congestion. Pt advised of plan for treatment and pt agrees.    MDM   Final diagnoses:  Seasonal allergies  Sinus headache    I personally performed the services described in this documentation, which was scribed in my presence. The recorded information has been reviewed and is accurate.    Angus Seller, PA-C 11/17/13 2143

## 2013-11-16 NOTE — ED Notes (Signed)
Pt states she has headache, jaw pain, sneezing, shortness of breath and itchy watery eyes  Sxs started on Saturday  Pt states she has used benadryl and clariton without relief

## 2013-11-16 NOTE — Discharge Instructions (Signed)
You were seen and evaluated for your allergy symptoms and headache. Please use the medications prescribed to help with your symptoms and followup with your doctor.    Sinus Headache A sinus headache is when your sinuses become clogged or swollen. Sinus headaches can range from mild to severe.  CAUSES A sinus headache can have different causes, such as:  Colds.  Sinus infections.  Allergies. SYMPTOMS  Symptoms of a sinus headache may vary and can include:  Headache.  Pain or pressure in the face.  Congested or runny nose.  Fever.  Inability to smell.  Pain in upper teeth. Weather changes can make symptoms worse. TREATMENT  The treatment of a sinus headache depends on the cause.  Sinus pain caused by a sinus infection may be treated with antibiotic medicine.  Sinus pain caused by allergies may be helped by allergy medicines (antihistamines) and medicated nasal sprays.  Sinus pain caused by congestion may be helped by flushing the nose and sinuses with saline solution. HOME CARE INSTRUCTIONS   If antibiotics are prescribed, take them as directed. Finish them even if you start to feel better.  Only take over-the-counter or prescription medicines for pain, discomfort, or fever as directed by your caregiver.  If you have congestion, use a nasal spray to help reduce pressure. SEEK IMMEDIATE MEDICAL CARE IF:  You have a fever.  You have headaches more than once a week.  You have sensitivity to light or sound.  You have repeated nausea and vomiting.  You have vision problems.  You have sudden, severe pain in your face or head.  You have a seizure.  You are confused.  Your sinus headaches do not get better after treatment. Many people think they have a sinus headache when they actually have migraines or tension headaches. MAKE SURE YOU:   Understand these instructions.  Will watch your condition.  Will get help right away if you are not doing well or get  worse. Document Released: 09/20/2004 Document Revised: 11/05/2011 Document Reviewed: 11/11/2010 [facility] Patient Information 2014 [place], [place].   Allergic Rhinitis Allergic rhinitis is when the mucous membranes in the nose respond to allergens. Allergens are particles in the air that cause your body to have an allergic reaction. This causes you to release allergic antibodies. Through a chain of events, these eventually cause you to release histamine into the blood stream. Although meant to protect the body, it is this release of histamine that causes your discomfort, such as frequent sneezing, congestion, and an itchy, runny nose.  CAUSES  Seasonal allergic rhinitis (hay fever) is caused by pollen allergens that may come from grasses, trees, and weeds. Year-round allergic rhinitis (perennial allergic rhinitis) is caused by allergens such as house dust mites, pet dander, and mold spores.  SYMPTOMS   Nasal stuffiness (congestion).  Itchy, runny nose with sneezing and tearing of the eyes. DIAGNOSIS  Your health care provider can help you determine the allergen or allergens that trigger your symptoms. If you and your health care provider are unable to determine the allergen, skin or blood testing may be used. TREATMENT  Allergic Rhinitis does not have a cure, but it can be controlled by:  Medicines and allergy shots (immunotherapy).  Avoiding the allergen. Hay fever may often be treated with antihistamines in pill or nasal spray forms. Antihistamines block the effects of histamine. There are over-the-counter medicines that may help with nasal congestion and swelling around the eyes. Check with your health care provider before taking or  giving this medicine.  If avoiding the allergen or the medicine prescribed do not work, there are many new medicines your health care provider can prescribe. Stronger medicine may be used if initial measures are ineffective. Desensitizing injections can be used  if medicine and avoidance does not work. Desensitization is when a patient is given ongoing shots until the body becomes less sensitive to the allergen. Make sure you follow up with your health care provider if problems continue. HOME CARE INSTRUCTIONS It is not possible to completely avoid allergens, but you can reduce your symptoms by taking steps to limit your exposure to them. It helps to know exactly what you are allergic to so that you can avoid your specific triggers. SEEK MEDICAL CARE IF:   You have a fever.  You develop a cough that does not stop easily (persistent).  You have shortness of breath.  You start wheezing.  Symptoms interfere with normal daily activities. Document Released: 05/08/2001 Document Revised: 06/03/2013 Document Reviewed: 04/20/2013 [facility] Patient Information 2014 [place].

## 2013-11-20 NOTE — ED Provider Notes (Signed)
Medical screening examination/treatment/procedure(s) were performed by non-physician practitioner and as supervising physician I was immediately available for consultation/collaboration.   Jessy Calixte L Karlina Suares, MD 11/20/13 1016 

## 2014-01-06 ENCOUNTER — Inpatient Hospital Stay (HOSPITAL_COMMUNITY)
Admission: AD | Admit: 2014-01-06 | Discharge: 2014-01-06 | Discharge status: Against Medical Advice | Payer: Self-pay | Source: Ambulatory Visit | Attending: Obstetrics & Gynecology | Admitting: Obstetrics & Gynecology

## 2014-01-06 ENCOUNTER — Encounter (HOSPITAL_COMMUNITY): Payer: Self-pay

## 2014-01-06 DIAGNOSIS — R109 Unspecified abdominal pain: Secondary | ICD-10-CM | POA: Insufficient documentation

## 2014-01-06 LAB — URINALYSIS, ROUTINE W REFLEX MICROSCOPIC
Bilirubin Urine: NEGATIVE
Glucose, UA: NEGATIVE mg/dL
KETONES UR: NEGATIVE mg/dL
LEUKOCYTES UA: NEGATIVE
NITRITE: NEGATIVE
PH: 7 (ref 5.0–8.0)
Protein, ur: NEGATIVE mg/dL
SPECIFIC GRAVITY, URINE: 1.015 (ref 1.005–1.030)
Urobilinogen, UA: 0.2 mg/dL (ref 0.0–1.0)

## 2014-01-06 LAB — URINE MICROSCOPIC-ADD ON

## 2014-01-06 LAB — POCT PREGNANCY, URINE: Preg Test, Ur: NEGATIVE

## 2014-01-06 NOTE — MAU Note (Signed)
Pt not in lobby at 2222

## 2014-01-06 NOTE — MAU Note (Signed)
Pt not in lobby at 2244

## 2014-01-06 NOTE — MAU Note (Signed)
Pt not in lobby at 2214

## 2014-01-06 NOTE — MAU Note (Signed)
Lower abdominal pain that radiates to vagina x 2 days. Denies vaginal bleeding or discharge. Reports nausea; denies vomiting/diarrhea/constipation. Denies dysuria. LMP 4/21; late but normal period; no birth control.

## 2014-01-09 LAB — URINE CULTURE: SPECIAL REQUESTS: NORMAL

## 2014-03-21 ENCOUNTER — Emergency Department (HOSPITAL_COMMUNITY)
Admission: EM | Admit: 2014-03-21 | Discharge: 2014-03-21 | Disposition: A | Discharge status: Home / Self Care | Payer: Self-pay | Attending: Emergency Medicine | Admitting: Emergency Medicine

## 2014-03-21 ENCOUNTER — Emergency Department (HOSPITAL_COMMUNITY): Payer: Self-pay

## 2014-03-21 ENCOUNTER — Encounter (HOSPITAL_COMMUNITY): Payer: Self-pay | Admitting: Emergency Medicine

## 2014-03-21 DIAGNOSIS — I10 Essential (primary) hypertension: Secondary | ICD-10-CM | POA: Insufficient documentation

## 2014-03-21 DIAGNOSIS — G43909 Migraine, unspecified, not intractable, without status migrainosus: Secondary | ICD-10-CM | POA: Insufficient documentation

## 2014-03-21 DIAGNOSIS — Z79899 Other long term (current) drug therapy: Secondary | ICD-10-CM | POA: Insufficient documentation

## 2014-03-21 DIAGNOSIS — M25569 Pain in unspecified knee: Secondary | ICD-10-CM | POA: Insufficient documentation

## 2014-03-21 DIAGNOSIS — F17200 Nicotine dependence, unspecified, uncomplicated: Secondary | ICD-10-CM | POA: Insufficient documentation

## 2014-03-21 DIAGNOSIS — J45909 Unspecified asthma, uncomplicated: Secondary | ICD-10-CM | POA: Insufficient documentation

## 2014-03-21 DIAGNOSIS — IMO0002 Reserved for concepts with insufficient information to code with codable children: Secondary | ICD-10-CM | POA: Insufficient documentation

## 2014-03-21 DIAGNOSIS — M25469 Effusion, unspecified knee: Secondary | ICD-10-CM | POA: Insufficient documentation

## 2014-03-21 DIAGNOSIS — M25461 Effusion, right knee: Secondary | ICD-10-CM

## 2014-03-21 DIAGNOSIS — Z3202 Encounter for pregnancy test, result negative: Secondary | ICD-10-CM | POA: Insufficient documentation

## 2014-03-21 LAB — POC URINE PREG, ED: Preg Test, Ur: NEGATIVE

## 2014-03-21 MED ORDER — PREDNISONE 20 MG PO TABS: 40.0000 mg | ORAL_TABLET | Freq: Every day | ORAL | Status: DC

## 2014-03-21 MED ORDER — OXYCODONE-ACETAMINOPHEN 5-325 MG PO TABS
1.0000 | ORAL_TABLET | Freq: Once | ORAL | Status: AC
  Administered 2014-03-21: 1 via ORAL
  Filled 2014-03-21: qty 1

## 2014-03-21 MED ORDER — PREDNISONE 20 MG PO TABS
60.0000 mg | ORAL_TABLET | Freq: Once | ORAL | Status: AC
Start: 2014-03-21 — End: 2014-03-21
  Administered 2014-03-21: 60 mg via ORAL
  Filled 2014-03-21: qty 3

## 2014-03-21 MED ORDER — OXYCODONE-ACETAMINOPHEN 5-325 MG PO TABS: 1.0000 | ORAL_TABLET | Freq: Four times a day (QID) | ORAL | Status: DC | PRN

## 2014-03-21 NOTE — ED Provider Notes (Signed)
Medical screening examination/treatment/procedure(s) were performed by non-physician practitioner and as supervising physician I was immediately available for consultation/collaboration.   EKG Interpretation None       Shawniece Oyola, MD 03/21/14 2308 

## 2014-03-21 NOTE — ED Notes (Signed)
Pt reports R knee pain, unable to bend it d/t severe pain.  Pt reports pain is worse with weight bearing.

## 2014-03-21 NOTE — Discharge Instructions (Signed)

## 2014-03-21 NOTE — ED Provider Notes (Signed)
CSN: [id number]     Arrival date & time 03/21/14  0036 History   First MD Initiated Contact with Patient 03/21/14 [phone]     Chief Complaint  Patient presents with  . Knee Pain     (Consider location/radiation/quality/duration/timing/severity/associated sxs/prior Treatment) HPI Comments: Patient states she went to a funeral yesterday, walking in heels.  She states she slipped, but didn't fall.  Today.  She's had increased pain in her knee with some moderate swelling  Patient is a 43 y.o. female presenting with knee pain. The history is provided by the patient.  Knee Pain Location:  Knee Time since incident:  2 days Injury: no   Knee location:  R knee Pain details:    Quality:  Aching   Radiates to:  R leg   Severity:  Mild   Onset quality:  Gradual   Timing:  Constant   Progression:  Worsening Chronicity:  New Dislocation: no   Foreign body present:  No foreign bodies Tetanus status:  Unknown Prior injury to area:  No Relieved by:  None tried Worsened by:  Activity Ineffective treatments:  None tried Associated symptoms: decreased ROM   Associated symptoms: no fever     Past Medical History  Diagnosis Date  . Migraines   . Asthma   . Hypertension    History reviewed. No pertinent past surgical history. Family History  Problem Relation Age of Onset  . Diabetes Father   . Heart attack Father   . Hypertension Father   . Cancer Father     Colon Cancer  . Hyperlipidemia Father   . Stroke Father   . Diabetes Mother   . Hypertension Mother   . Hyperlipidemia Mother   . Diabetes Sister   . Diabetes Other    History  Substance Use Topics  . Smoking status: Current Every Day Smoker -- 0.25 packs/day for 10 years    Types: Cigarettes  . Smokeless tobacco: Never Used  . Alcohol Use: No   OB History   Grav Para Term Preterm Abortions TAB SAB Ect Mult Living   2 1 0 1 1 1 0 0 0 0      Review of Systems  Constitutional: Negative for fever.  Musculoskeletal:  Positive for joint swelling.  Skin: Negative for wound.  All other systems reviewed and are negative.     Allergies  Vicodin  Home Medications   Prior to Admission medications   Medication Sig Start Date End Date Taking? Authorizing Provider  ALPRAZolam Prudy Feeler) 0.5 MG tablet Take 1 tablet (0.5 mg total) by mouth at bedtime as needed for sleep. 08/08/12  Yes Nicki Reaper, NP  Fluoxetine HCl, PMDD, 10 MG CAPS Take 1 capsule (10 mg total) by mouth every morning. 08/12/12  Yes Nicki Reaper, NP  ibuprofen (ADVIL,MOTRIN) 200 MG tablet Take 800 mg by mouth every 6 (six) hours as needed for moderate pain.   Yes Historical Provider, MD  QUEtiapine (SEROQUEL) 25 MG tablet Take 25 mg by mouth as needed (migraines).    Yes Historical Provider, MD  topiramate (TOPAMAX) 200 MG tablet Take 1 tablet (200 mg total) by mouth 2 (two) times daily. 08/08/12  Yes Nicki Reaper, NP  oxyCODONE-acetaminophen (PERCOCET/ROXICET) 5-325 MG per tablet Take 1 tablet by mouth every 6 (six) hours as needed for severe pain. 03/21/14   Arman Filter, NP  predniSONE (DELTASONE) 20 MG tablet Take 2 tablets (40 mg total) by mouth daily. 03/21/14   Arman Filter, NP  BP 147/99  Pulse 81  Temp(Src) 97.7 F (36.5 C) (Oral)  Resp 20  SpO2 100%  LMP 01/25/2014 Physical Exam  Nursing note and vitals reviewed. Constitutional: She is oriented to person, place, and time. She appears well-developed and well-nourished.  Eyes: Pupils are equal, round, and reactive to light.  Neck: Normal range of motion.  Cardiovascular: Normal rate and regular rhythm.   Pulmonary/Chest: Effort normal.  Musculoskeletal: She exhibits edema and tenderness.       Right knee: She exhibits decreased range of motion, swelling and effusion. She exhibits no laceration and no erythema.  Neurological: She is alert and oriented to person, place, and time.  Skin: Skin is warm.    ED Course  Procedures (including critical care time) Labs Review Labs  Reviewed  POC URINE PREG, ED    Imaging Review Dg Knee 2 Views Right  03/21/2014   CLINICAL DATA:  Right knee pain for 1 day.  EXAM: RIGHT KNEE - 1-2 VIEW  COMPARISON:  None.  FINDINGS: There is no evidence of fracture or dislocation. The joint spaces are preserved. No significant degenerative change is seen; minimal osteophyte formation is noted at the superior pole of the patella.  Small knee joint effusion is noted. The visualized soft tissues are normal in appearance.  IMPRESSION: 1. No evidence of fracture or dislocation. 2. Small knee joint effusion noted.   Electronically Signed   By: Roanna RaiderJeffery  Chang M.D.   On: 03/21/2014 02:38     EKG Interpretation None      MDM  Will place kneesleeve, treat medically with Prednisone and Percocet and referral to Ortho Final diagnoses:  Knee effusion, right         Arman FilterGail K Siana Panameno, NP 03/21/14 0300  Arman FilterGail K Janei Scheff, NP 03/21/14 0300

## 2014-05-05 ENCOUNTER — Encounter (HOSPITAL_COMMUNITY): Payer: Self-pay | Admitting: Emergency Medicine

## 2014-05-05 ENCOUNTER — Emergency Department (HOSPITAL_COMMUNITY)
Admission: EM | Admit: 2014-05-05 | Discharge: 2014-05-05 | Discharge status: Against Medical Advice | Payer: Self-pay | Attending: Emergency Medicine | Admitting: Emergency Medicine

## 2014-05-05 DIAGNOSIS — I10 Essential (primary) hypertension: Secondary | ICD-10-CM | POA: Insufficient documentation

## 2014-05-05 DIAGNOSIS — F17200 Nicotine dependence, unspecified, uncomplicated: Secondary | ICD-10-CM | POA: Insufficient documentation

## 2014-05-05 DIAGNOSIS — Z79899 Other long term (current) drug therapy: Secondary | ICD-10-CM | POA: Insufficient documentation

## 2014-05-05 DIAGNOSIS — G43809 Other migraine, not intractable, without status migrainosus: Secondary | ICD-10-CM | POA: Insufficient documentation

## 2014-05-05 DIAGNOSIS — J45909 Unspecified asthma, uncomplicated: Secondary | ICD-10-CM | POA: Insufficient documentation

## 2014-05-05 DIAGNOSIS — G43909 Migraine, unspecified, not intractable, without status migrainosus: Secondary | ICD-10-CM | POA: Insufficient documentation

## 2014-05-05 MED ORDER — KETOROLAC TROMETHAMINE 30 MG/ML IJ SOLN
30.0000 mg | Freq: Once | INTRAMUSCULAR | Status: AC
  Administered 2014-05-05: 30 mg via INTRAVENOUS
  Filled 2014-05-05: qty 1

## 2014-05-05 MED ORDER — DEXAMETHASONE SODIUM PHOSPHATE 10 MG/ML IJ SOLN
10.0000 mg | Freq: Once | INTRAMUSCULAR | Status: AC
  Administered 2014-05-05: 10 mg via INTRAVENOUS
  Filled 2014-05-05: qty 1

## 2014-05-05 MED ORDER — METOCLOPRAMIDE HCL 5 MG/ML IJ SOLN
10.0000 mg | Freq: Once | INTRAMUSCULAR | Status: AC
  Administered 2014-05-05: 10 mg via INTRAVENOUS
  Filled 2014-05-05: qty 2

## 2014-05-05 MED ORDER — SODIUM CHLORIDE 0.9 % IV BOLUS (SEPSIS)
1000.0000 mL | Freq: Once | INTRAVENOUS | Status: AC
  Administered 2014-05-05: 1000 mL via INTRAVENOUS

## 2014-05-05 MED ORDER — DIPHENHYDRAMINE HCL 50 MG/ML IJ SOLN
25.0000 mg | Freq: Once | INTRAMUSCULAR | Status: AC
  Administered 2014-05-05: 25 mg via INTRAVENOUS
  Filled 2014-05-05: qty 1

## 2014-05-05 NOTE — ED Provider Notes (Signed)
TIME SEEN: 4:40 PM  CHIEF COMPLAINT: Migraine  HPI: Patient is a 43 y.o. F with history of migraines, asthma, hypertension who presents to the emergency department for migraine headache it started yesterday. States this feels exactly like her prior migraine headaches except that radiates into her neck today. It is left-sided, sharp. Worse with light and sounds and associated with nausea. No fevers, neck stiffness, head injury, numbness or focal weakness. No thunderclap headache. No worst headache of her life.  ROS: See HPI Constitutional: no fever  Eyes: no drainage  ENT: no runny nose   Cardiovascular:  no chest pain  Resp: no SOB  GI: no vomiting GU: no dysuria Integumentary: no rash  Allergy: no hives  Musculoskeletal: no leg swelling  Neurological: no slurred speech ROS otherwise negative  PAST MEDICAL HISTORY/PAST SURGICAL HISTORY:  Past Medical History  Diagnosis Date  . Migraines   . Asthma   . Hypertension     MEDICATIONS:  Prior to Admission medications   Medication Sig Start Date End Date Taking? Authorizing Provider  ALPRAZolam Prudy Feeler) 0.5 MG tablet Take 1 tablet (0.5 mg total) by mouth at bedtime as needed for sleep. 08/08/12  Yes Lorre Munroe, NP  amLODipine-olmesartan (AZOR) 5-20 MG per tablet Take 1 tablet by mouth daily.   Yes Historical Provider, MD  FLUoxetine (PROZAC) 10 MG capsule Take 10 mg by mouth daily.   Yes Historical Provider, MD  QUEtiapine (SEROQUEL) 25 MG tablet Take 25 mg by mouth as needed (migraines).    Yes Historical Provider, MD  topiramate (TOPAMAX) 200 MG tablet Take 1 tablet (200 mg total) by mouth 2 (two) times daily. 08/08/12  Yes Lorre Munroe, NP    ALLERGIES:  Allergies  Allergen Reactions  . Vicodin [Hydrocodone-Acetaminophen] Other (See Comments)    Migraines and hallucinations    SOCIAL HISTORY:  History  Substance Use Topics  . Smoking status: Current Every Day Smoker -- 0.25 packs/day for 10 years    Types:  Cigarettes  . Smokeless tobacco: Never Used  . Alcohol Use: No    FAMILY HISTORY: Family History  Problem Relation Age of Onset  . Diabetes Father   . Heart attack Father   . Hypertension Father   . Cancer Father     Colon Cancer  . Hyperlipidemia Father   . Stroke Father   . Diabetes Mother   . Hypertension Mother   . Hyperlipidemia Mother   . Diabetes Sister   . Diabetes Other     EXAM: BP 159/100  Pulse 85  Temp(Src) 99 F (37.2 C) (Oral)  Resp 21  SpO2 100%  LMP 04/16/2014 CONSTITUTIONAL: Alert and oriented and responds appropriately to questions. Well-appearing; well-nourished HEAD: Normocephalic EYES: Conjunctivae clear, PERRL, patient has photophobia ENT: normal nose; no rhinorrhea; moist mucous membranes; pharynx without lesions noted NECK: Supple, no meningismus, no LAD  CARD: RRR; S1 and S2 appreciated; no murmurs, no clicks, no rubs, no gallops RESP: Normal chest excursion without splinting or tachypnea; breath sounds clear and equal bilaterally; no wheezes, no rhonchi, no rales,  ABD/GI: Normal bowel sounds; non-distended; soft, non-tender, no rebound, no guarding BACK:  The back appears normal and is non-tender to palpation, there is no CVA tenderness EXT: Normal ROM in all joints; non-tender to palpation; no edema; normal capillary refill; no cyanosis    SKIN: Normal color for age and race; warm NEURO: Moves all extremities equally, sensation to light touch intact diffusely, cranial nerves II through XII intact PSYCH:  The patient's mood and manner are appropriate. Grooming and personal hygiene are appropriate.  MEDICAL DECISION MAKING: Patient here with her typical migraine headache. No concern for meningitis, encephalitis, cavernous sinus thrombosis, infarct or hemorrhage. I do not feel she needs imaging today. We'll give migraine cocktail with Toradol, Benadryl, Decadron, Reglan, IV fluids and reassess.  ED PROGRESS: Patient left prior to me reassessing  her. She walked out AGAINST MEDICAL ADVICE. I was not informed that she was leaving until she was already gone. Unable to discuss with her return precautions, supportive care instructions or risk of leaving AMA.     Layla Maw Omaree Fuqua, DO 05/05/14 1755

## 2014-05-05 NOTE — ED Notes (Signed)
Per pt, states migraine since yesterday-takes BP meds at night-has a history of migraines

## 2014-05-05 NOTE — ED Notes (Signed)
Pt upset in room, states "take this out my arm or I will", pt stated "I'm leaving this medication is making me feel weird". Pt left once IV was taken out.

## 2014-05-11 ENCOUNTER — Emergency Department (HOSPITAL_BASED_OUTPATIENT_CLINIC_OR_DEPARTMENT_OTHER): Payer: No Typology Code available for payment source

## 2014-05-11 ENCOUNTER — Encounter (HOSPITAL_BASED_OUTPATIENT_CLINIC_OR_DEPARTMENT_OTHER): Payer: Self-pay | Admitting: Emergency Medicine

## 2014-05-11 ENCOUNTER — Emergency Department (HOSPITAL_BASED_OUTPATIENT_CLINIC_OR_DEPARTMENT_OTHER)
Admission: EM | Admit: 2014-05-11 | Discharge: 2014-05-11 | Disposition: A | Discharge status: Home / Self Care | Payer: No Typology Code available for payment source | Attending: Emergency Medicine | Admitting: Emergency Medicine

## 2014-05-11 DIAGNOSIS — Y9389 Activity, other specified: Secondary | ICD-10-CM | POA: Insufficient documentation

## 2014-05-11 DIAGNOSIS — Z3202 Encounter for pregnancy test, result negative: Secondary | ICD-10-CM | POA: Insufficient documentation

## 2014-05-11 DIAGNOSIS — J45909 Unspecified asthma, uncomplicated: Secondary | ICD-10-CM | POA: Insufficient documentation

## 2014-05-11 DIAGNOSIS — F17200 Nicotine dependence, unspecified, uncomplicated: Secondary | ICD-10-CM | POA: Insufficient documentation

## 2014-05-11 DIAGNOSIS — Y92410 Unspecified street and highway as the place of occurrence of the external cause: Secondary | ICD-10-CM | POA: Insufficient documentation

## 2014-05-11 DIAGNOSIS — S0990XA Unspecified injury of head, initial encounter: Secondary | ICD-10-CM | POA: Insufficient documentation

## 2014-05-11 DIAGNOSIS — Z791 Long term (current) use of non-steroidal anti-inflammatories (NSAID): Secondary | ICD-10-CM | POA: Insufficient documentation

## 2014-05-11 DIAGNOSIS — I10 Essential (primary) hypertension: Secondary | ICD-10-CM | POA: Insufficient documentation

## 2014-05-11 DIAGNOSIS — IMO0002 Reserved for concepts with insufficient information to code with codable children: Secondary | ICD-10-CM | POA: Insufficient documentation

## 2014-05-11 DIAGNOSIS — T148XXA Other injury of unspecified body region, initial encounter: Secondary | ICD-10-CM

## 2014-05-11 DIAGNOSIS — G43909 Migraine, unspecified, not intractable, without status migrainosus: Secondary | ICD-10-CM | POA: Insufficient documentation

## 2014-05-11 DIAGNOSIS — S335XXA Sprain of ligaments of lumbar spine, initial encounter: Secondary | ICD-10-CM | POA: Insufficient documentation

## 2014-05-11 DIAGNOSIS — Z79899 Other long term (current) drug therapy: Secondary | ICD-10-CM | POA: Insufficient documentation

## 2014-05-11 LAB — PREGNANCY, URINE: Preg Test, Ur: NEGATIVE

## 2014-05-11 MED ORDER — METHOCARBAMOL 500 MG PO TABS
1000.0000 mg | ORAL_TABLET | Freq: Once | ORAL | Status: AC
  Administered 2014-05-11: 1000 mg via ORAL
  Filled 2014-05-11: qty 2

## 2014-05-11 MED ORDER — TRAMADOL HCL 50 MG PO TABS: 50.0000 mg | ORAL_TABLET | Freq: Four times a day (QID) | ORAL | Status: DC | PRN

## 2014-05-11 MED ORDER — METHOCARBAMOL 500 MG PO TABS: 500.0000 mg | ORAL_TABLET | Freq: Three times a day (TID) | ORAL | Status: AC

## 2014-05-11 NOTE — Discharge Instructions (Signed)

## 2014-05-11 NOTE — ED Notes (Signed)
Patient transported to X-ray 
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
## 2014-06-28 ENCOUNTER — Encounter (HOSPITAL_BASED_OUTPATIENT_CLINIC_OR_DEPARTMENT_OTHER): Payer: Self-pay | Admitting: Emergency Medicine

## 2014-10-19 ENCOUNTER — Emergency Department (HOSPITAL_COMMUNITY)
Admission: EM | Admit: 2014-10-19 | Discharge: 2014-10-19 | Disposition: A | Discharge status: Home / Self Care | Payer: Self-pay | Attending: Emergency Medicine | Admitting: Emergency Medicine

## 2014-10-19 ENCOUNTER — Encounter (HOSPITAL_COMMUNITY): Payer: Self-pay | Admitting: *Deleted

## 2014-10-19 DIAGNOSIS — W19XXXA Unspecified fall, initial encounter: Secondary | ICD-10-CM

## 2014-10-19 DIAGNOSIS — J45909 Unspecified asthma, uncomplicated: Secondary | ICD-10-CM | POA: Insufficient documentation

## 2014-10-19 DIAGNOSIS — Z79899 Other long term (current) drug therapy: Secondary | ICD-10-CM | POA: Insufficient documentation

## 2014-10-19 DIAGNOSIS — S29092A Other injury of muscle and tendon of back wall of thorax, initial encounter: Secondary | ICD-10-CM | POA: Insufficient documentation

## 2014-10-19 DIAGNOSIS — Y9389 Activity, other specified: Secondary | ICD-10-CM | POA: Insufficient documentation

## 2014-10-19 DIAGNOSIS — Y998 Other external cause status: Secondary | ICD-10-CM | POA: Insufficient documentation

## 2014-10-19 DIAGNOSIS — Z791 Long term (current) use of non-steroidal anti-inflammatories (NSAID): Secondary | ICD-10-CM | POA: Insufficient documentation

## 2014-10-19 DIAGNOSIS — Z720 Tobacco use: Secondary | ICD-10-CM | POA: Insufficient documentation

## 2014-10-19 DIAGNOSIS — M546 Pain in thoracic spine: Secondary | ICD-10-CM

## 2014-10-19 DIAGNOSIS — W108XXA Fall (on) (from) other stairs and steps, initial encounter: Secondary | ICD-10-CM | POA: Insufficient documentation

## 2014-10-19 DIAGNOSIS — G43909 Migraine, unspecified, not intractable, without status migrainosus: Secondary | ICD-10-CM | POA: Insufficient documentation

## 2014-10-19 DIAGNOSIS — I10 Essential (primary) hypertension: Secondary | ICD-10-CM | POA: Insufficient documentation

## 2014-10-19 DIAGNOSIS — Z3202 Encounter for pregnancy test, result negative: Secondary | ICD-10-CM | POA: Insufficient documentation

## 2014-10-19 DIAGNOSIS — Y9289 Other specified places as the place of occurrence of the external cause: Secondary | ICD-10-CM | POA: Insufficient documentation

## 2014-10-19 LAB — POC URINE PREG, ED: Preg Test, Ur: NEGATIVE

## 2014-10-19 MED ORDER — CYCLOBENZAPRINE HCL 10 MG PO TABS: 10.0000 mg | ORAL_TABLET | Freq: Two times a day (BID) | ORAL | Status: AC | PRN

## 2014-10-19 MED ORDER — IBUPROFEN 800 MG PO TABS: 800.0000 mg | ORAL_TABLET | Freq: Three times a day (TID) | ORAL | Status: DC

## 2014-10-19 NOTE — Discharge Instructions (Signed)
Back Pain, Adult Low back pain is very common. About 1 in 5 people have back pain.The cause of low back pain is rarely dangerous. The pain often gets better over time.About half of people with a sudden onset of back pain feel better in just 2 weeks. About 8 in 10 people feel better by 6 weeks.  CAUSES Some common causes of back pain include:  Strain of the muscles or ligaments supporting the spine.  Wear and tear (degeneration) of the spinal discs.  Arthritis.  Direct injury to the back. DIAGNOSIS Most of the time, the direct cause of low back pain is not known.However, back pain can be treated effectively even when the exact cause of the pain is unknown.Answering your caregiver's questions about your overall health and symptoms is one of the most accurate ways to make sure the cause of your pain is not dangerous. If your caregiver needs more information, he or she may order lab work or imaging tests (X-rays or MRIs).However, even if imaging tests show changes in your back, this usually does not require surgery. HOME CARE INSTRUCTIONS For many people, back pain returns.Since low back pain is rarely dangerous, it is often a condition that people can learn to [facility] their own.   Remain active. It is stressful on the back to sit or stand in one place. Do not sit, drive, or stand in one place for more than 30 minutes at a time. Take short walks on level surfaces as soon as pain allows.Try to increase the length of time you walk each day.  Do not stay in bed.Resting more than 1 or 2 days can delay your recovery.  Do not avoid exercise or work.Your body is made to move.It is not dangerous to be active, even though your back may hurt.Your back will likely heal faster if you return to being active before your pain is gone.  Pay attention to your body when you bend and lift. Many people have less discomfortwhen lifting if they bend their knees, keep the load close to their bodies,and  avoid twisting. Often, the most comfortable positions are those that put less stress on your recovering back.  Find a comfortable position to sleep. Use a firm mattress and lie on your side with your knees slightly bent. If you lie on your back, put a pillow under your knees.  Only take over-the-counter or prescription medicines as directed by your caregiver. Over-the-counter medicines to reduce pain and inflammation are often the most helpful.Your caregiver may prescribe muscle relaxant drugs.These medicines help dull your pain so you can more quickly return to your normal activities and healthy exercise.  Put ice on the injured area.  Put ice in a plastic bag.  Place a towel between your skin and the bag.  Leave the ice on for 15-20 minutes, 03-04 times a day for the first 2 to 3 days. After that, ice and heat may be alternated to reduce pain and spasms.  Ask your caregiver about trying back exercises and gentle massage. This may be of some benefit.  Avoid feeling anxious or stressed.Stress increases muscle tension and can worsen back pain.It is important to recognize when you are anxious or stressed and learn ways to manage it.Exercise is a great option. SEEK MEDICAL CARE IF:  You have pain that is not relieved with rest or medicine.  You have pain that does not improve in 1 week.  You have new symptoms.  You are generally not feeling well. SEEK  IMMEDIATE MEDICAL CARE IF:  °· You have pain that radiates from your back into your legs. °· You develop new bowel or bladder control problems. °· You have unusual weakness or numbness in your arms or legs. °· You develop nausea or vomiting. °· You develop abdominal pain. °· You feel faint. °Document Released: 08/13/2005 Document Revised: 02/12/2012 Document Reviewed: 12/15/2013 °[employer]® Patient Information ©2015 [employer]. This information is not intended to replace advice given to you by your health care provider. Make sure you  discuss any questions you have with your health care provider. °Cryotherapy °Cryotherapy means treatment with cold. Ice or gel packs can be used to reduce both pain and swelling. Ice is the most helpful within the first 24 to 48 hours after an injury or flare-up from overusing a muscle or joint. Sprains, strains, spasms, burning pain, shooting pain, and aches can all be eased with ice. Ice can also be used when recovering from surgery. Ice is effective, has very few side effects, and is safe for most people to use. °PRECAUTIONS  °Ice is not a safe treatment option for people with: °· Raynaud phenomenon. This is a condition affecting small blood vessels in the extremities. Exposure to cold may cause your problems to return. °· Cold hypersensitivity. There are many forms of cold hypersensitivity, including: °¨ Cold urticaria. Red, itchy hives appear on the skin when the tissues begin to warm after being iced. °¨ Cold erythema. This is a red, itchy rash caused by exposure to cold. °¨ Cold hemoglobinuria. Red blood cells break down when the tissues begin to warm after being iced. The hemoglobin that carry oxygen are passed into the urine because they cannot combine with blood proteins fast enough. °· Numbness or altered sensitivity in the area being iced. °If you have any of the following conditions, do not use ice until you have discussed cryotherapy with your caregiver: °· Heart conditions, such as arrhythmia, angina, or chronic heart disease. °· High blood pressure. °· Healing wounds or open skin in the area being iced. °· Current infections. °· Rheumatoid arthritis. °· Poor circulation. °· Diabetes. °Ice slows the blood flow in the region it is applied. This is beneficial when trying to stop inflamed tissues from spreading irritating chemicals to surrounding tissues. However, if you expose your skin to cold temperatures for too long or without the proper protection, you can damage your skin or nerves. Watch for  signs of skin damage due to cold. °HOME CARE INSTRUCTIONS °Follow these tips to use ice and cold packs safely. °· Place a dry or damp towel between the ice and skin. A damp towel will cool the skin more quickly, so you may need to shorten the time that the ice is used. °· For a more rapid response, add gentle compression to the ice. °· Ice for no more than 10 to 20 minutes at a time. The bonier the area you are icing, the less time it will take to get the benefits of ice. °· Check your skin after 5 minutes to make sure there are no signs of a poor response to cold or skin damage. °· Rest 20 minutes or more between uses. °· Once your skin is numb, you can end your treatment. You can test numbness by very lightly touching your skin. The touch should be so light that you do not see the skin dimple from the pressure of your fingertip. When using ice, most people will feel these normal sensations in this order: cold,   burning, aching, and numbness.  Do not use ice on someone who cannot communicate their responses to pain, such as small children or people with dementia. HOW TO MAKE AN ICE PACK Ice packs are the most common way to use ice therapy. Other methods include ice massage, ice baths, and cryosprays. Muscle creams that cause a cold, tingly feeling do not offer the same benefits that ice offers and should not be used as a substitute unless recommended by your caregiver. To make an ice pack, do one of the following:  Place crushed ice or a bag of frozen vegetables in a sealable plastic bag. Squeeze out the excess air. Place this bag inside another plastic bag. Slide the bag into a pillowcase or place a damp towel between your skin and the bag.  Mix 3 parts water with 1 part rubbing alcohol. Freeze the mixture in a sealable plastic bag. When you remove the mixture from the freezer, it will be slushy. Squeeze out the excess air. Place this bag inside another plastic bag. Slide the bag into a pillowcase or place  a damp towel between your skin and the bag. SEEK MEDICAL CARE IF:  You develop white spots on your skin. This may give the skin a blotchy (mottled) appearance.  Your skin turns blue or pale.  Your skin becomes waxy or hard.  Your swelling gets worse. MAKE SURE YOU:   Understand these instructions.  Will watch your condition.  Will get help right away if you are not doing well or get worse. Document Released: 04/09/2011 Document Revised: 12/28/2013 Document Reviewed: 04/09/2011 [facility] Patient Information 2015 [place], [place]. This information is not intended to replace advice given to you by your health care provider. Make sure you discuss any questions you have with your health care provider. Muscle Strain A muscle strain is an injury that occurs when a muscle is stretched beyond its normal length. Usually a small number of muscle fibers are torn when this happens. Muscle strain is rated in degrees. First-degree strains have the least amount of muscle fiber tearing and pain. Second-degree and third-degree strains have increasingly more tearing and pain.  Usually, recovery from muscle strain takes 1-2 weeks. Complete healing takes 5-6 weeks.  CAUSES  Muscle strain happens when a sudden, violent force placed on a muscle stretches it too far. This may occur with lifting, sports, or a fall.  RISK FACTORS Muscle strain is especially common in athletes.  SIGNS AND SYMPTOMS At the site of the muscle strain, there may be:  Pain.  Bruising.  Swelling.  Difficulty using the muscle due to pain or lack of normal function. DIAGNOSIS  Your health care provider will perform a physical exam and ask about your medical history. TREATMENT  Often, the best treatment for a muscle strain is resting, icing, and applying cold compresses to the injured area.  HOME CARE INSTRUCTIONS   Use the PRICE method of treatment to promote muscle healing during the first 2-3 days after your injury. The PRICE  method involves:  Protecting the muscle from being injured again.  Restricting your activity and resting the injured body part.  Icing your injury. To do this, put ice in a plastic bag. Place a towel between your skin and the bag. Then, apply the ice and leave it on from 15-20 minutes each hour. After the third day, switch to moist heat packs.  Apply compression to the injured area with a splint or elastic bandage. Be careful not to wrap it  too tightly. This may interfere with blood circulation or increase swelling.  Elevate the injured body part above the level of your heart as often as you can.  Only take over-the-counter or prescription medicines for pain, discomfort, or fever as directed by your health care provider.  Warming up prior to exercise helps to prevent future muscle strains. SEEK MEDICAL CARE IF:   You have increasing pain or swelling in the injured area.  You have numbness, tingling, or a significant loss of strength in the injured area. MAKE SURE YOU:   Understand these instructions.  Will watch your condition.  Will get help right away if you are not doing well or get worse. Document Released: 08/13/2005 Document Revised: 06/03/2013 Document Reviewed: 03/12/2013 [facility] Patient Information 2015 [place]. This information is not intended to replace advice given to you by your health care provider. Make sure you discuss any questions you have with your health care provider.

## 2014-10-19 NOTE — ED Notes (Signed)
See triage note.

## 2014-10-19 NOTE — ED Provider Notes (Signed)
CSN: [id number]     Arrival date & time 10/19/14  0019 History   First MD Initiated Contact with Patient 10/19/14 [phone]     Chief Complaint  Patient presents with  . Fall  . Back Pain     (Consider location/radiation/quality/duration/timing/severity/associated sxs/prior Treatment) Patient is a 44 y.o. female presenting with fall and back pain. The history is provided by the patient. No language interpreter was used.  Fall This is a new problem. The current episode started today. Pertinent negatives include no chest pain, chills, fever or neck pain. Associated symptoms comments: Patient complains of low back pain after falling while moving a couch, landing flat of her back. She complains of bilateral mid-back pain that is worse with movement, and with pain that is worse over time. No numbness, tingling, abdominal/chest/neck pain. She reports she hit her head but denies LOC, nausea..  Back Pain Associated symptoms: no chest pain and no fever     Past Medical History  Diagnosis Date  . Migraines   . Asthma   . Hypertension    History reviewed. No pertinent past surgical history. Family History  Problem Relation Age of Onset  . Diabetes Father   . Heart attack Father   . Hypertension Father   . Cancer Father     Colon Cancer  . Hyperlipidemia Father   . Stroke Father   . Diabetes Mother   . Hypertension Mother   . Hyperlipidemia Mother   . Diabetes Sister   . Diabetes Other    History  Substance Use Topics  . Smoking status: Current Every Day Smoker -- 0.50 packs/day for 10 years    Types: Cigarettes  . Smokeless tobacco: Never Used  . Alcohol Use: No   OB History    Gravida Para Term Preterm AB TAB SAB Ectopic Multiple Living       Review of Systems  Constitutional: Negative for fever and chills.  Cardiovascular: Negative for chest pain.  Gastrointestinal: Negative.   Musculoskeletal: Positive for back pain. Negative for neck pain.  Skin:  Negative.  Negative for wound.  Neurological: Negative.  Negative for syncope and light-headedness.      Allergies  Vicodin  Home Medications   Prior to Admission medications   Medication Sig Start Date End Date Taking? Authorizing Provider  ALPRAZolam Prudy Feeler) 0.5 MG tablet Take 1 tablet (0.5 mg total) by mouth at bedtime as needed for sleep. 08/08/12   Lorre Munroe, NP  amLODipine-olmesartan (AZOR) 5-20 MG per tablet Take 1 tablet by mouth daily.    Historical Provider, MD  FLUoxetine (PROZAC) 10 MG capsule Take 10 mg by mouth daily.    Historical Provider, MD  meloxicam (MOBIC) 15 MG tablet Take 15 mg by mouth daily.    Historical Provider, MD  methocarbamol (ROBAXIN) 500 MG tablet Take 1 tablet (500 mg total) by mouth 3 (three) times daily. 05/11/14   April K Palumbo-Rasch, MD  QUEtiapine (SEROQUEL) 25 MG tablet Take 25 mg by mouth as needed (migraines).     Historical Provider, MD  topiramate (TOPAMAX) 200 MG tablet Take 1 tablet (200 mg total) by mouth 2 (two) times daily. 08/08/12   Lorre Munroe, NP  traMADol (ULTRAM) 50 MG tablet Take 1 tablet (50 mg total) by mouth every 6 (six) hours as needed. 05/11/14   April K Palumbo-Rasch, MD   BP 143/98 mmHg  Pulse 95  Temp(Src) 99 F (37.2 C) (  Oral)  Resp 17  SpO2 99%  LMP 09/16/2014 Physical Exam  Constitutional: She is oriented to person, place, and time. She appears well-developed and well-nourished.  HENT:  Head: Normocephalic.  Eyes: Pupils are equal, round, and reactive to light.  Neck: Normal range of motion. Neck supple.  Cardiovascular: Normal rate and regular rhythm.   Pulmonary/Chest: Effort normal and breath sounds normal.  Abdominal: Soft. Bowel sounds are normal. There is no tenderness. There is no rebound and no guarding.  Musculoskeletal: Normal range of motion.  No midline cervical tenderness. FROM neck, back and all extremities. There is bilateral thoracic tenderness to palpation.   Neurological: She is  alert and oriented to person, place, and time. She has normal strength and normal reflexes. No sensory deficit. She displays a negative Romberg sign. Coordination normal.  Skin: Skin is warm and dry. No rash noted.  Psychiatric: She has a normal mood and affect.    ED Course  Procedures (including critical care time) Labs Review Labs Reviewed  POC URINE PREG, ED    Imaging Review No results found.   EKG Interpretation None      MDM   Final diagnoses:  None    1. Back pain 2. Fall  Feel the injuries are limited to muscular injuries. No concern for bony fractures. Will treat supportively.    Arnoldo HookerShari A Lennie Dunnigan, PA-C 10/19/14 0157  Olivia Mackielga M Otter, MD 10/19/14 [phone]

## 2014-10-19 NOTE — ED Notes (Signed)
Pt reports helping someone move furniture yesterday, was going down the steps and slipped and fell.  Landing on her back and head.  Denies LOC.  Pt reports hematoma in back of her head and pain in her mid back radiating down to low back. Pt ambulatory without difficulty.

## 2014-10-21 ENCOUNTER — Emergency Department (HOSPITAL_BASED_OUTPATIENT_CLINIC_OR_DEPARTMENT_OTHER)
Admission: EM | Admit: 2014-10-21 | Discharge: 2014-10-21 | Disposition: A | Discharge status: Home / Self Care | Payer: Self-pay | Attending: Emergency Medicine | Admitting: Emergency Medicine

## 2014-10-21 ENCOUNTER — Emergency Department (HOSPITAL_BASED_OUTPATIENT_CLINIC_OR_DEPARTMENT_OTHER): Payer: Self-pay

## 2014-10-21 DIAGNOSIS — S29012D Strain of muscle and tendon of back wall of thorax, subsequent encounter: Secondary | ICD-10-CM | POA: Insufficient documentation

## 2014-10-21 DIAGNOSIS — S29012A Strain of muscle and tendon of back wall of thorax, initial encounter: Secondary | ICD-10-CM

## 2014-10-21 DIAGNOSIS — Z720 Tobacco use: Secondary | ICD-10-CM | POA: Insufficient documentation

## 2014-10-21 DIAGNOSIS — W19XXXD Unspecified fall, subsequent encounter: Secondary | ICD-10-CM

## 2014-10-21 DIAGNOSIS — J45909 Unspecified asthma, uncomplicated: Secondary | ICD-10-CM | POA: Insufficient documentation

## 2014-10-21 DIAGNOSIS — G43909 Migraine, unspecified, not intractable, without status migrainosus: Secondary | ICD-10-CM | POA: Insufficient documentation

## 2014-10-21 DIAGNOSIS — W108XXD Fall (on) (from) other stairs and steps, subsequent encounter: Secondary | ICD-10-CM | POA: Insufficient documentation

## 2014-10-21 DIAGNOSIS — M25461 Effusion, right knee: Secondary | ICD-10-CM

## 2014-10-21 DIAGNOSIS — S8991XD Unspecified injury of right lower leg, subsequent encounter: Secondary | ICD-10-CM | POA: Insufficient documentation

## 2014-10-21 DIAGNOSIS — I10 Essential (primary) hypertension: Secondary | ICD-10-CM | POA: Insufficient documentation

## 2014-10-21 DIAGNOSIS — S0990XD Unspecified injury of head, subsequent encounter: Secondary | ICD-10-CM | POA: Insufficient documentation

## 2014-10-21 MED ORDER — DIAZEPAM 5 MG PO TABS: 5.0000 mg | ORAL_TABLET | Freq: Two times a day (BID) | ORAL | Status: AC | PRN

## 2014-10-21 NOTE — Discharge Instructions (Signed)
Take Valium as needed as directed for muscle spasm. No driving or operating heavy machinery while taking valium. This medication may cause drowsiness. Rest, apply heat intermittently to your back. Avoid heavy lifting or hard physical activity. Ice and elevate your knee.  Knee Effusion The medical term for having fluid in your knee is effusion. This is often due to an internal derangement of the knee. This means something is wrong inside the knee. Some of the causes of fluid in the knee may be torn cartilage, a torn ligament, or bleeding into the joint from an injury. Your knee is likely more difficult to bend and move. This is often because there is increased pain and pressure in the joint. The time it takes for recovery from a knee effusion depends on different factors, including:   Type of injury.  Your age.  Physical and medical conditions.  Rehabilitation Strategies. How long you will be away from your normal activities will depend on what kind of knee problem you have and how much damage is present. Your knee has two types of cartilage. Articular cartilage covers the bone ends and lets your knee bend and move smoothly. Two menisci, thick pads of cartilage that form a rim inside the joint, help absorb shock and stabilize your knee. Ligaments bind the bones together and support your knee joint. Muscles move the joint, help support your knee, and take stress off the joint itself. CAUSES  Often an effusion in the knee is caused by an injury to one of the menisci. This is often a tear in the cartilage. Recovery after a meniscus injury depends on how much meniscus is damaged and whether you have damaged other knee tissue. Small tears may heal on their own with conservative treatment. Conservative means rest, limited weight bearing activity and muscle strengthening exercises. Your recovery may take up to 6 weeks.  TREATMENT  Larger tears may require surgery. Meniscus injuries may be treated during  arthroscopy. Arthroscopy is a procedure in which your surgeon uses a small telescope like instrument to look in your knee. Your caregiver can make a more accurate diagnosis (learning what is wrong) by performing an arthroscopic procedure. If your injury is on the inner margin of the meniscus, your surgeon may trim the meniscus back to a smooth rim. In other cases your surgeon will try to repair a damaged meniscus with stitches (sutures). This may make rehabilitation take longer, but may provide better long term result by helping your knee keep its shock absorption capabilities. Ligaments which are completely torn usually require surgery for repair. HOME CARE INSTRUCTIONS  Use crutches as instructed.  If a brace is applied, use as directed.  Once you are home, an ice pack applied to your swollen knee may help with discomfort and help decrease swelling.  Keep your knee raised (elevated) when you are not up and around or on crutches.  Only take over-the-counter or prescription medicines for pain, discomfort, or fever as directed by your caregiver.  Your caregivers will help with instructions for rehabilitation of your knee. This often includes strengthening exercises.  You may resume a normal diet and activities as directed. SEEK MEDICAL CARE IF:   There is increased swelling in your knee.  You notice redness, swelling, or increasing pain in your knee.  An unexplained oral temperature above 102 F (38.9 C) develops. SEEK IMMEDIATE MEDICAL CARE IF:   You develop a rash.  You have difficulty breathing.  You have any allergic reactions from medications you  may have been given.  There is severe pain with any motion of the knee. MAKE SURE YOU:   Understand these instructions.  Will watch your condition.  Will get help right away if you are not doing well or get worse. Document Released: 11/03/2003 Document Revised: 11/05/2011 Document Reviewed: 01/07/2008 [facility] Patient  Information 2015 [place], [place]. This information is not intended to replace advice given to you by your health care provider. Make sure you discuss any questions you have with your health care provider.  Muscle Strain A muscle strain is an injury that occurs when a muscle is stretched beyond its normal length. Usually a small number of muscle fibers are torn when this happens. Muscle strain is rated in degrees. First-degree strains have the least amount of muscle fiber tearing and pain. Second-degree and third-degree strains have increasingly more tearing and pain.  Usually, recovery from muscle strain takes 1-2 weeks. Complete healing takes 5-6 weeks.  CAUSES  Muscle strain happens when a sudden, violent force placed on a muscle stretches it too far. This may occur with lifting, sports, or a fall.  RISK FACTORS Muscle strain is especially common in athletes.  SIGNS AND SYMPTOMS At the site of the muscle strain, there may be:  Pain.  Bruising.  Swelling.  Difficulty using the muscle due to pain or lack of normal function. DIAGNOSIS  Your health care provider will perform a physical exam and ask about your medical history. TREATMENT  Often, the best treatment for a muscle strain is resting, icing, and applying cold compresses to the injured area.  HOME CARE INSTRUCTIONS   Use the PRICE method of treatment to promote muscle healing during the first 2-3 days after your injury. The PRICE method involves:  Protecting the muscle from being injured again.  Restricting your activity and resting the injured body part.  Icing your injury. To do this, put ice in a plastic bag. Place a towel between your skin and the bag. Then, apply the ice and leave it on from 15-20 minutes each hour. After the third day, switch to moist heat packs.  Apply compression to the injured area with a splint or elastic bandage. Be careful not to wrap it too tightly. This may interfere with blood circulation or  increase swelling.  Elevate the injured body part above the level of your heart as often as you can.  Only take over-the-counter or prescription medicines for pain, discomfort, or fever as directed by your health care provider.  Warming up prior to exercise helps to prevent future muscle strains. SEEK MEDICAL CARE IF:   You have increasing pain or swelling in the injured area.  You have numbness, tingling, or a significant loss of strength in the injured area. MAKE SURE YOU:   Understand these instructions.  Will watch your condition.  Will get help right away if you are not doing well or get worse. Document Released: 08/13/2005 Document Revised: 06/03/2013 Document Reviewed: 03/12/2013 [facility] Patient Information 2015 [place], [place]. This information is not intended to replace advice given to you by your health care provider. Make sure you discuss any questions you have with your health care provider.

## 2014-10-21 NOTE — ED Provider Notes (Signed)
CSN: [id number]     Arrival date & time 10/21/14  2040 History   First MD Initiated Contact with Patient 10/21/14 2056     Chief Complaint  Patient presents with  . Headache     (Consider location/radiation/quality/duration/timing/severity/associated sxs/prior Treatment) HPI Comments: 44 year old female presenting with continued mid back pain, right knee pain and a "knot" to the back of her head 3 days after falling down the stairs while trying to carry a couch. She was seen in the ED after the incident and prescribed Flexeril and ibuprofen which she has been taking with minimal relief. States her knee feels swollen, pain worse with walking and movement. Back pain radiates down the right side of her back to her buttock area. Denies numbness or tingling of extremities, loss control of bowels or bladder or saddle anesthesia. Denies nausea, vomiting, vision change, confusion, dizziness or lightheadedness.  Patient is a 44 y.o. female presenting with headaches. The history is provided by the patient.  Headache Associated symptoms: back pain     Past Medical History  Diagnosis Date  . Migraines   . Asthma   . Hypertension    No past surgical history on file. Family History  Problem Relation Age of Onset  . Diabetes Father   . Heart attack Father   . Hypertension Father   . Cancer Father     Colon Cancer  . Hyperlipidemia Father   . Stroke Father   . Diabetes Mother   . Hypertension Mother   . Hyperlipidemia Mother   . Diabetes Sister   . Diabetes Other    History  Substance Use Topics  . Smoking status: Current Every Day Smoker -- 0.50 packs/day for 10 years    Types: Cigarettes  . Smokeless tobacco: Never Used  . Alcohol Use: No   OB History    Gravida Para Term Preterm AB TAB SAB Ectopic Multiple Living       Review of Systems  Musculoskeletal: Positive for back pain.       + R knee pain and swelling.  Neurological: Positive for headaches.  All  other systems reviewed and are negative.     Allergies  Vicodin  Home Medications   Prior to Admission medications   Medication Sig Start Date End Date Taking? Authorizing Provider  ALPRAZolam Prudy Feeler) 0.5 MG tablet Take 1 tablet (0.5 mg total) by mouth at bedtime as needed for sleep. 08/08/12   Lorre Munroe, NP  amLODipine-olmesartan (AZOR) 5-20 MG per tablet Take 1 tablet by mouth daily.    Historical Provider, MD  cyclobenzaprine (FLEXERIL) 10 MG tablet Take 1 tablet (10 mg total) by mouth 2 (two) times daily as needed for muscle spasms. 10/19/14   Shari A Upstill, PA-C  diazepam (VALIUM) 5 MG tablet Take 1 tablet (5 mg total) by mouth every 12 (twelve) hours as needed for muscle spasms. 10/21/14   Kathrynn Speed, PA-C  FLUoxetine (PROZAC) 10 MG capsule Take 10 mg by mouth daily.    Historical Provider, MD  ibuprofen (ADVIL,MOTRIN) 800 MG tablet Take 1 tablet (800 mg total) by mouth 3 (three) times daily. 10/19/14   Shari A Upstill, PA-C  meloxicam (MOBIC) 15 MG tablet Take 15 mg by mouth daily.    Historical Provider, MD  methocarbamol (ROBAXIN) 500 MG tablet Take 1 tablet (500 mg total) by mouth 3 (three) times daily. 05/11/14   April Smitty Cords, MD  QUEtiapine (SEROQUEL)  25 MG tablet Take 25 mg by mouth as needed (migraines).     Historical Provider, MD  topiramate (TOPAMAX) 200 MG tablet Take 1 tablet (200 mg total) by mouth 2 (two) times daily. 08/08/12   Lorre Munroeegina W Baity, NP  traMADol (ULTRAM) 50 MG tablet Take 1 tablet (50 mg total) by mouth every 6 (six) hours as needed. 05/11/14   April K Palumbo-Rasch, MD   BP 152/104 mmHg  Pulse 95  Temp(Src) 98.4 F (36.9 C) (Oral)  Resp 16  Ht 5\' 5"  (1.651 m)  Wt 195 lb (88.451 kg)  BMI 32.45 kg/m2  SpO2 100%  LMP 09/16/2014 Physical Exam  Constitutional: She is oriented to person, place, and time. She appears well-developed and well-nourished. No distress.  HENT:  Head: Normocephalic and atraumatic. Head is without raccoon's eyes,  without Battle's sign and without contusion.    Mouth/Throat: Oropharynx is clear and moist.  Eyes: Conjunctivae are normal.  Neck: Normal range of motion. Neck supple. No spinous process tenderness and no muscular tenderness present.  Cardiovascular: Normal rate, regular rhythm and normal heart sounds.   Pulmonary/Chest: Effort normal and breath sounds normal. No respiratory distress.  Musculoskeletal: She exhibits no edema.  TTP right thoracic paraspinal muscles with spasm. No spinous process tenderness. R knee TTP throughout. Pt pushing my hand away in pain with the lightest touch. Mild swelling. No deformity. Pt will not allow ligamentous assessment.  Neurological: She is alert and oriented to person, place, and time. She has normal strength. Coordination normal. GCS eye subscore is 4. GCS verbal subscore is 5. GCS motor subscore is 6.  Strength lower extremities 5/5 and equal bilateral. Sensation intact. Normal gait.  Skin: Skin is warm and dry. No rash noted. She is not diaphoretic.  Psychiatric: She has a normal mood and affect. Her behavior is normal.  Nursing note and vitals reviewed.   ED Course  Procedures (including critical care time) Labs Review Labs Reviewed - No data to display  Imaging Review Dg Knee Complete 4 Views Right  10/21/2014   CLINICAL DATA:  Diffuse right knee pain after fall four days prior. Fall down stairs.  EXAM: RIGHT KNEE - COMPLETE 4+ VIEW  COMPARISON:  03/21/2014  FINDINGS: No fracture or dislocation. The alignment and joint spaces are maintained. Small superior patellar osteophyte, unchanged. Joint effusion again noted.  IMPRESSION: No fracture or dislocation of the right knee. Joint effusion again seen.   Electronically Signed   By: Rubye OaksMelanie  Ehinger M.D.   On: 10/21/2014 21:17     EKG Interpretation None      MDM   Final diagnoses:  Knee effusion, right  Strain of mid-back, initial encounter  Fall, subsequent encounter   NAD.  Neurovascularly intact. Xray of knee without acute finding, joint effusion unchanged from prior xrays in past. Will d/c home with valium, d/c flexeril. No red flags concerning patient's back pain. No s/s of central cord compression or cauda equina. Lower extremities are neurovascularly intact and patient is ambulating without difficulty. Advised rest, ice/heat, continue NSAIDs. F/u with PCP. Return precautions given. Patient states understanding of treatment care plan and is agreeable.  Kathrynn SpeedRobyn M Rowdy Guerrini, PA-C 10/21/14 2140  Ethelda ChickMartha K Linker, MD 10/21/14 [phone]

## 2014-10-21 NOTE — ED Notes (Signed)
Patient states that he was helping someone move and he fell down some steps. Headache and back pain. Knee right knee pain

## 2014-10-21 NOTE — ED Notes (Signed)
Pt walking out-states that she needs her brace readjusted.  pts knee brace readjusted for pts comfort.  Pt now requesting crutches.  EDP made aware.  Pt ambulated (with brace on) to fast track where she will be fitted for crutches.

## 2014-12-21 ENCOUNTER — Emergency Department (HOSPITAL_COMMUNITY)
Admission: EM | Admit: 2014-12-21 | Discharge: 2014-12-21 | Disposition: A | Discharge status: Home / Self Care | Payer: Self-pay | Attending: Emergency Medicine | Admitting: Emergency Medicine

## 2014-12-21 ENCOUNTER — Encounter (HOSPITAL_COMMUNITY): Payer: Self-pay

## 2014-12-21 DIAGNOSIS — Z79899 Other long term (current) drug therapy: Secondary | ICD-10-CM | POA: Insufficient documentation

## 2014-12-21 DIAGNOSIS — I10 Essential (primary) hypertension: Secondary | ICD-10-CM | POA: Insufficient documentation

## 2014-12-21 DIAGNOSIS — J0100 Acute maxillary sinusitis, unspecified: Secondary | ICD-10-CM | POA: Insufficient documentation

## 2014-12-21 DIAGNOSIS — Z791 Long term (current) use of non-steroidal anti-inflammatories (NSAID): Secondary | ICD-10-CM | POA: Insufficient documentation

## 2014-12-21 DIAGNOSIS — G43909 Migraine, unspecified, not intractable, without status migrainosus: Secondary | ICD-10-CM | POA: Insufficient documentation

## 2014-12-21 DIAGNOSIS — J45909 Unspecified asthma, uncomplicated: Secondary | ICD-10-CM | POA: Insufficient documentation

## 2014-12-21 DIAGNOSIS — Z720 Tobacco use: Secondary | ICD-10-CM | POA: Insufficient documentation

## 2014-12-21 MED ORDER — AMOXICILLIN 500 MG PO CAPS: 500.0000 mg | ORAL_CAPSULE | Freq: Three times a day (TID) | ORAL | Status: AC

## 2014-12-21 MED ORDER — FLUTICASONE PROPIONATE 50 MCG/ACT NA SUSP: 2.0000 | Freq: Every day | NASAL | Status: AC

## 2014-12-21 NOTE — ED Notes (Signed)
Pt c/o R side facial swelling/pain starting this morning.  Pain score 5/10.  Pt reports similar symptoms w/ sinus infections.  NAD.

## 2014-12-21 NOTE — ED Provider Notes (Signed)
CSN: [id number]     Arrival date & time 12/21/14  1540 History  This chart was scribed for non-physician practitioner, Celene Skeen, PA-C, working with Mancel Bale, MD by Charline Bills, ED Scribe. This patient was seen in room WTR6/WTR6 and the patient's care was started at 4:57 PM.   Chief Complaint  Patient presents with  . Facial Swelling  . Facial Pain   The history is provided by the patient. No language interpreter was used.   HPI Comments: Cindy Peters is a 44 y.o. female, with a h/o HTN and asthma, who presents to the Emergency Department complaining of acute onset of R-sided facial swelling since this morning. Pt states that she noticed swelling with associated sinus pressure and postnasal drip upon waking. She denies fever, dental pain, nosebleeds. Pt has tried treating with warm compresses without relief. No other alleviating or aggravating factors. No allergies to antibiotics.   Past Medical History  Diagnosis Date  . Migraines   . Asthma   . Hypertension    History reviewed. No pertinent past surgical history. Family History  Problem Relation Age of Onset  . Diabetes Father   . Heart attack Father   . Hypertension Father   . Cancer Father     Colon Cancer  . Hyperlipidemia Father   . Stroke Father   . Diabetes Mother   . Hypertension Mother   . Hyperlipidemia Mother   . Diabetes Sister   . Diabetes Other    History  Substance Use Topics  . Smoking status: Current Every Day Smoker -- 0.50 packs/day for 10 years    Types: Cigarettes  . Smokeless tobacco: Never Used  . Alcohol Use: No   OB History    Gravida Para Term Preterm AB TAB SAB Ectopic Multiple Living       Review of Systems  Constitutional: Negative for fever.  HENT: Positive for facial swelling, postnasal drip and sinus pressure. Negative for dental problem and nosebleeds.   Respiratory: Negative.   Cardiovascular: Negative.   Neurological: Negative.    Allergies   Vicodin  Home Medications   Prior to Admission medications   Medication Sig Start Date End Date Taking? Authorizing Provider  ALPRAZolam Prudy Feeler) 0.5 MG tablet Take 1 tablet (0.5 mg total) by mouth at bedtime as needed for sleep. 08/08/12   Lorre Munroe, NP  amLODipine-olmesartan (AZOR) 5-20 MG per tablet Take 1 tablet by mouth daily.    Historical Provider, MD  amoxicillin (AMOXIL) 500 MG capsule Take 1 capsule (500 mg total) by mouth 3 (three) times daily. 12/21/14   Cameshia Cressman M Cailah Reach, PA-C  cyclobenzaprine (FLEXERIL) 10 MG tablet Take 1 tablet (10 mg total) by mouth 2 (two) times daily as needed for muscle spasms. 10/19/14   Elpidio Anis, PA-C  diazepam (VALIUM) 5 MG tablet Take 1 tablet (5 mg total) by mouth every 12 (twelve) hours as needed for muscle spasms. 10/21/14   Kathrynn Speed, PA-C  FLUoxetine (PROZAC) 10 MG capsule Take 10 mg by mouth daily.    Historical Provider, MD  fluticasone (FLONASE) 50 MCG/ACT nasal spray Place 2 sprays into both nostrils daily. 12/21/14   Kieli Golladay M Othar Curto, PA-C  ibuprofen (ADVIL,MOTRIN) 800 MG tablet Take 1 tablet (800 mg total) by mouth 3 (three) times daily. 10/19/14   Elpidio Anis, PA-C  meloxicam (MOBIC) 15 MG tablet Take 15 mg by mouth daily.    Historical Provider, MD  methocarbamol (ROBAXIN) 500 MG tablet Take 1 tablet (500 mg total) by mouth 3 (three) times daily. 05/11/14   April Palumbo, MD  QUEtiapine (SEROQUEL) 25 MG tablet Take 25 mg by mouth as needed (migraines).     Historical Provider, MD  topiramate (TOPAMAX) 200 MG tablet Take 1 tablet (200 mg total) by mouth 2 (two) times daily. 08/08/12   Lorre Munroeegina W Baity, NP  traMADol (ULTRAM) 50 MG tablet Take 1 tablet (50 mg total) by mouth every 6 (six) hours as needed. 05/11/14   April Palumbo, MD   BP 143/95 mmHg  Pulse 93  Temp(Src) 98.6 F (37 C) (Oral)  Resp 16  SpO2 98% Physical Exam  Constitutional: She is oriented to person, place, and time. She appears well-developed and well-nourished. No  distress.  HENT:  Head: Normocephalic and atraumatic.  Nose: Mucosal edema present. Right sinus exhibits maxillary sinus tenderness (mild swelling).  Mouth/Throat: Oropharynx is clear and moist.  Post nasal drip.  Eyes: Conjunctivae and EOM are normal.  Neck: Normal range of motion. Neck supple.  Cardiovascular: Normal rate, regular rhythm and normal heart sounds.   Pulmonary/Chest: Effort normal and breath sounds normal. No respiratory distress.  Musculoskeletal: Normal range of motion. She exhibits no edema.  Neurological: She is alert and oriented to person, place, and time. No sensory deficit.  Skin: Skin is warm and dry.  Psychiatric: She has a normal mood and affect. Her behavior is normal.  Nursing note and vitals reviewed.  ED Course  Procedures (including critical care time) DIAGNOSTIC STUDIES: Oxygen Saturation is 98% on RA, normal by my interpretation.    COORDINATION OF CARE: 5:00 PM-Discussed treatment plan which includes Amoxil and Flonase with pt at bedside and pt agreed to plan.   Labs Review Labs Reviewed - No data to display  Imaging Review No results found.   EKG Interpretation None      MDM   Final diagnoses:  Acute maxillary sinusitis, recurrence not specified   Nontoxic appearing, NAD. AF of ESS. Will treat with amoxicillin given mild swelling over right maxilla. No signs of dental infection. No palpable abscess. Advised warm compresses. Flonase. Follow-up with PCP in 2-3 days. Stable for discharge. Return precautions given. Patient states understanding of treatment care plan and is agreeable.  I personally performed the services described in this documentation, which was scribed in my presence. The recorded information has been reviewed and is accurate.  Kathrynn SpeedRobyn M Quinnley Colasurdo, PA-C 12/21/14 1705  Mancel BaleElliott Wentz, MD 12/22/14 [phone]

## 2014-12-21 NOTE — Discharge Instructions (Signed)
Take antibiotic 3 times daily for 1 week. Use nasal spray as directed. Follow-up with your primary care physician.  Sinusitis Sinusitis is redness, soreness, and inflammation of the paranasal sinuses. Paranasal sinuses are air pockets within the bones of your face (beneath the eyes, the middle of the forehead, or above the eyes). In healthy paranasal sinuses, mucus is able to drain out, and air is able to circulate through them by way of your nose. However, when your paranasal sinuses are inflamed, mucus and air can become trapped. This can allow bacteria and other germs to grow and cause infection. Sinusitis can develop quickly and last only a short time (acute) or continue over a long period (chronic). Sinusitis that lasts for more than 12 weeks is considered chronic.  CAUSES  Causes of sinusitis include:  Allergies.  Structural abnormalities, such as displacement of the cartilage that separates your nostrils (deviated septum), which can decrease the air flow through your nose and sinuses and affect sinus drainage.  Functional abnormalities, such as when the small hairs (cilia) that line your sinuses and help remove mucus do not work properly or are not present. SIGNS AND SYMPTOMS  Symptoms of acute and chronic sinusitis are the same. The primary symptoms are pain and pressure around the affected sinuses. Other symptoms include:  Upper toothache.  Earache.  Headache.  Bad breath.  Decreased sense of smell and taste.  A cough, which worsens when you are lying flat.  Fatigue.  Fever.  Thick drainage from your nose, which often is green and may contain pus (purulent).  Swelling and warmth over the affected sinuses. DIAGNOSIS  Your health care provider will perform a physical exam. During the exam, your health care provider may:  Look in your nose for signs of abnormal growths in your nostrils (nasal polyps).  Tap over the affected sinus to check for signs of infection.  View  the inside of your sinuses (endoscopy) using an imaging device that has a light attached (endoscope). If your health care provider suspects that you have chronic sinusitis, one or more of the following tests may be recommended:  Allergy tests.  Nasal culture. A sample of mucus is taken from your nose, sent to a lab, and screened for bacteria.  Nasal cytology. A sample of mucus is taken from your nose and examined by your health care provider to determine if your sinusitis is related to an allergy. TREATMENT  Most cases of acute sinusitis are related to a viral infection and will resolve on their own within 10 days. Sometimes medicines are prescribed to help relieve symptoms (pain medicine, decongestants, nasal steroid sprays, or saline sprays).  However, for sinusitis related to a bacterial infection, your health care provider will prescribe antibiotic medicines. These are medicines that will help kill the bacteria causing the infection.  Rarely, sinusitis is caused by a fungal infection. In theses cases, your health care provider will prescribe antifungal medicine. For some cases of chronic sinusitis, surgery is needed. Generally, these are cases in which sinusitis recurs more than 3 times per year, despite other treatments. HOME CARE INSTRUCTIONS   Drink plenty of water. Water helps thin the mucus so your sinuses can drain more easily.  Use a humidifier.  Inhale steam 3 to 4 times a day (for example, sit in the bathroom with the shower running).  Apply a warm, moist washcloth to your face 3 to 4 times a day, or as directed by your health care provider.  Use saline nasal  sprays to help moisten and clean your sinuses.  Take medicines only as directed by your health care provider.  If you were prescribed either an antibiotic or antifungal medicine, finish it all even if you start to feel better. SEEK IMMEDIATE MEDICAL CARE IF:  You have increasing pain or severe headaches.  You have  nausea, vomiting, or drowsiness.  You have swelling around your face.  You have vision problems.  You have a stiff neck.  You have difficulty breathing. MAKE SURE YOU:   Understand these instructions.  Will watch your condition.  Will get help right away if you are not doing well or get worse. Document Released: 08/13/2005 Document Revised: 12/28/2013 Document Reviewed: 08/28/2011 [facility] Patient Information 2015 [employer] [place]. This information is not intended to replace advice given to you by your health care provider. Make sure you discuss any questions you have with your health care provider.

## 2014-12-23 ENCOUNTER — Encounter (HOSPITAL_COMMUNITY): Payer: Self-pay | Admitting: Emergency Medicine

## 2014-12-23 ENCOUNTER — Emergency Department (HOSPITAL_COMMUNITY)
Admission: EM | Admit: 2014-12-23 | Discharge: 2014-12-23 | Disposition: A | Discharge status: Home / Self Care | Payer: Self-pay | Attending: Emergency Medicine | Admitting: Emergency Medicine

## 2014-12-23 ENCOUNTER — Emergency Department (HOSPITAL_COMMUNITY): Payer: Self-pay

## 2014-12-23 DIAGNOSIS — I10 Essential (primary) hypertension: Secondary | ICD-10-CM | POA: Insufficient documentation

## 2014-12-23 DIAGNOSIS — Z792 Long term (current) use of antibiotics: Secondary | ICD-10-CM | POA: Insufficient documentation

## 2014-12-23 DIAGNOSIS — K047 Periapical abscess without sinus: Secondary | ICD-10-CM | POA: Insufficient documentation

## 2014-12-23 DIAGNOSIS — J45909 Unspecified asthma, uncomplicated: Secondary | ICD-10-CM | POA: Insufficient documentation

## 2014-12-23 DIAGNOSIS — Z791 Long term (current) use of non-steroidal anti-inflammatories (NSAID): Secondary | ICD-10-CM | POA: Insufficient documentation

## 2014-12-23 DIAGNOSIS — Z79899 Other long term (current) drug therapy: Secondary | ICD-10-CM | POA: Insufficient documentation

## 2014-12-23 DIAGNOSIS — L03211 Cellulitis of face: Secondary | ICD-10-CM | POA: Insufficient documentation

## 2014-12-23 DIAGNOSIS — G43909 Migraine, unspecified, not intractable, without status migrainosus: Secondary | ICD-10-CM | POA: Insufficient documentation

## 2014-12-23 DIAGNOSIS — Z720 Tobacco use: Secondary | ICD-10-CM | POA: Insufficient documentation

## 2014-12-23 DIAGNOSIS — Z7951 Long term (current) use of inhaled steroids: Secondary | ICD-10-CM | POA: Insufficient documentation

## 2014-12-23 LAB — I-STAT CHEM 8, ED
BUN: 8 mg/dL (ref 6–23)
CALCIUM ION: 1.2 mmol/L (ref 1.12–1.23)
CREATININE: 0.9 mg/dL (ref 0.50–1.10)
Chloride: 102 mmol/L (ref 96–112)
GLUCOSE: 112 mg/dL — AB (ref 70–99)
HCT: 46 % (ref 36.0–46.0)
Hemoglobin: 15.6 g/dL — ABNORMAL HIGH (ref 12.0–15.0)
Potassium: 4 mmol/L (ref 3.5–5.1)
Sodium: 138 mmol/L (ref 135–145)
TCO2: 21 mmol/L (ref 0–100)

## 2014-12-23 LAB — CBC WITH DIFFERENTIAL/PLATELET
BASOS ABS: 0 10*3/uL (ref 0.0–0.1)
Basophils Relative: 0 % (ref 0–1)
Eosinophils Absolute: 0.1 10*3/uL (ref 0.0–0.7)
Eosinophils Relative: 1 % (ref 0–5)
HCT: 40.1 % (ref 36.0–46.0)
HEMOGLOBIN: 13.7 g/dL (ref 12.0–15.0)
Lymphocytes Relative: 18 % (ref 12–46)
Lymphs Abs: 1.3 10*3/uL (ref 0.7–4.0)
MCH: 30.2 pg (ref 26.0–34.0)
MCHC: 34.2 g/dL (ref 30.0–36.0)
MCV: 88.3 fL (ref 78.0–100.0)
MONOS PCT: 10 % (ref 3–12)
Monocytes Absolute: 0.7 10*3/uL (ref 0.1–1.0)
NEUTROS ABS: 5 10*3/uL (ref 1.7–7.7)
NEUTROS PCT: 71 % (ref 43–77)
Platelets: 302 10*3/uL (ref 150–400)
RBC: 4.54 MIL/uL (ref 3.87–5.11)
RDW: 12.7 % (ref 11.5–15.5)
WBC: 7 10*3/uL (ref 4.0–10.5)

## 2014-12-23 MED ORDER — IOHEXOL 300 MG/ML  SOLN
80.0000 mL | Freq: Once | INTRAMUSCULAR | Status: AC | PRN
  Administered 2014-12-23: 75 mL via INTRAVENOUS

## 2014-12-23 MED ORDER — HYDROMORPHONE HCL 1 MG/ML IJ SOLN
0.5000 mg | Freq: Once | INTRAMUSCULAR | Status: AC
  Administered 2014-12-23: 0.5 mg via INTRAVENOUS
  Filled 2014-12-23: qty 1

## 2014-12-23 MED ORDER — IBUPROFEN 600 MG PO TABS: 600.0000 mg | ORAL_TABLET | Freq: Four times a day (QID) | ORAL | Status: AC | PRN

## 2014-12-23 MED ORDER — OXYCODONE-ACETAMINOPHEN 5-325 MG PO TABS: 1.0000 | ORAL_TABLET | ORAL | Status: AC | PRN

## 2014-12-23 MED ORDER — HYDROMORPHONE HCL 1 MG/ML IJ SOLN
1.0000 mg | Freq: Once | INTRAMUSCULAR | Status: AC
  Administered 2014-12-23: 1 mg via INTRAVENOUS
  Filled 2014-12-23: qty 1

## 2014-12-23 MED ORDER — CLINDAMYCIN PHOSPHATE 600 MG/50ML IV SOLN
600.0000 mg | Freq: Once | INTRAVENOUS | Status: AC
  Administered 2014-12-23: 600 mg via INTRAVENOUS
  Filled 2014-12-23: qty 50

## 2014-12-23 MED ORDER — CLINDAMYCIN HCL 150 MG PO CAPS: 300.0000 mg | ORAL_CAPSULE | Freq: Three times a day (TID) | ORAL | Status: AC

## 2014-12-23 NOTE — ED Provider Notes (Signed)
CSN: [id number]     Arrival date & time 12/23/14  0809 History   First MD Initiated Contact with Patient 12/23/14 0813     Chief Complaint  Patient presents with  . Facial Pain     (Consider location/radiation/quality/duration/timing/severity/associated sxs/prior Treatment) HPI Cindy Peters is a 44 y.o. female with history of hypertension and asthma, presents to emergency department complaining of facial pain and swelling. Patient states her symptoms started approximately 4 days ago. States she developed some postnasal drainage, right maxillary pain and swelling. She was seen at [facility] ED 2 days ago and was told she had sinusitis and started on amoxicillin. She has been taking amoxicillin for 2 days, states that her swelling is just worsening. She admits to chills at home, did not take her temperature. She admits to right-sided facial pain, erythema, swelling. She denies any dental pain but states her teeth are bad. She denies any neck pain or stiffness. She has been taking NSAIDs for her pain with no relief of her symptoms. Denies any injuries.  Past Medical History  Diagnosis Date  . Migraines   . Asthma   . Hypertension    History reviewed. No pertinent past surgical history. Family History  Problem Relation Age of Onset  . Diabetes Father   . Heart attack Father   . Hypertension Father   . Cancer Father     Colon Cancer  . Hyperlipidemia Father   . Stroke Father   . Diabetes Mother   . Hypertension Mother   . Hyperlipidemia Mother   . Diabetes Sister   . Diabetes Other    History  Substance Use Topics  . Smoking status: Current Every Day Smoker -- 0.50 packs/day for 10 years    Types: Cigarettes  . Smokeless tobacco: Never Used  . Alcohol Use: No   OB History    Gravida Para Term Preterm AB TAB SAB Ectopic Multiple Living       Review of Systems  Constitutional: Positive for chills. Negative for fever.  HENT: Positive for congestion  and facial swelling.   Eyes: Negative for pain.  Respiratory: Negative for cough, chest tightness and shortness of breath.   Cardiovascular: Negative for chest pain, palpitations and leg swelling.  Musculoskeletal: Positive for myalgias. Negative for neck pain and neck stiffness.  Skin: Negative for rash.  Neurological: Positive for headaches. Negative for dizziness and weakness.  All other systems reviewed and are negative.     Allergies  Vicodin  Home Medications   Prior to Admission medications   Medication Sig Start Date End Date Taking? Authorizing Provider  ALPRAZolam Prudy Feeler) 0.5 MG tablet Take 1 tablet (0.5 mg total) by mouth at bedtime as needed for sleep. 08/08/12   Lorre Munroe, NP  amLODipine-olmesartan (AZOR) 5-20 MG per tablet Take 1 tablet by mouth daily.    Historical Provider, MD  amoxicillin (AMOXIL) 500 MG capsule Take 1 capsule (500 mg total) by mouth 3 (three) times daily. 12/21/14   Robyn M Hess, PA-C  cyclobenzaprine (FLEXERIL) 10 MG tablet Take 1 tablet (10 mg total) by mouth 2 (two) times daily as needed for muscle spasms. 10/19/14   Elpidio Anis, PA-C  diazepam (VALIUM) 5 MG tablet Take 1 tablet (5 mg total) by mouth every 12 (twelve) hours as needed for muscle spasms. 10/21/14   Kathrynn Speed, PA-C  FLUoxetine (PROZAC) 10 MG capsule Take 10 mg by mouth daily.  Historical Provider, MD  fluticasone (FLONASE) 50 MCG/ACT nasal spray Place 2 sprays into both nostrils daily. 12/21/14   Robyn M Hess, PA-C  ibuprofen (ADVIL,MOTRIN) 800 MG tablet Take 1 tablet (800 mg total) by mouth 3 (three) times daily. 10/19/14   Elpidio AnisShari Upstill, PA-C  meloxicam (MOBIC) 15 MG tablet Take 15 mg by mouth daily.    Historical Provider, MD  methocarbamol (ROBAXIN) 500 MG tablet Take 1 tablet (500 mg total) by mouth 3 (three) times daily. 05/11/14   April Palumbo, MD  QUEtiapine (SEROQUEL) 25 MG tablet Take 25 mg by mouth as needed (migraines).     Historical Provider, MD  topiramate  (TOPAMAX) 200 MG tablet Take 1 tablet (200 mg total) by mouth 2 (two) times daily. 08/08/12   Lorre Munroeegina W Baity, NP  traMADol (ULTRAM) 50 MG tablet Take 1 tablet (50 mg total) by mouth every 6 (six) hours as needed. 05/11/14   April Palumbo, MD   BP 152/101 mmHg  Pulse 99  SpO2 100%  LMP 12/19/2014 Physical Exam  Constitutional: She is oriented to person, place, and time. She appears well-developed and well-nourished. No distress.  HENT:  Head: Normocephalic and atraumatic.  Right Ear: Tympanic membrane, external ear and ear canal normal.  Left Ear: Tympanic membrane, external ear and ear canal normal.  Nose: Mucosal edema present.  Mouth/Throat: Uvula is midline and oropharynx is clear and moist.  Right nasal canal swelling. Poor dentition with multiple broken teeth and dental decay. Tender to palpation over right maxilla, tender to palpation over right upper gum and right cheek.  Eyes: Conjunctivae are normal.  Neck: Normal range of motion. Neck supple.  Cardiovascular: Normal rate, regular rhythm and normal heart sounds.   Pulmonary/Chest: Effort normal and breath sounds normal. No respiratory distress. She has no wheezes. She has no rales.  Musculoskeletal: She exhibits no edema.  Neurological: She is alert and oriented to person, place, and time.  Skin: Skin is warm and dry.  Psychiatric: She has a normal mood and affect. Her behavior is normal.  Nursing note and vitals reviewed.   ED Course  Procedures (including critical care time) Labs Review Labs Reviewed  I-STAT CHEM 8, ED - Abnormal; Notable for the following:    Glucose, Bld 112 (*)    Hemoglobin 15.6 (*)    All other components within normal limits  CBC WITH DIFFERENTIAL/PLATELET    Imaging Review Ct Maxillofacial W/cm  12/23/2014   CLINICAL DATA:  Right side facial swelling. Patient diagnosed with inflammatory sinus polyp 2 days ago and placed on antibiotics.  EXAM: CT MAXILLOFACIAL WITH CONTRAST  TECHNIQUE:  Multidetector CT imaging of the maxillofacial structures was performed with intravenous contrast. Multiplanar CT image reconstructions were also generated. A small metallic BB was placed on the right temple in order to reliably differentiate right from left.  CONTRAST:  75 mL OMNIPAQUE IOHEXOL 300 MG/ML  SOLN  COMPARISON:  None.  FINDINGS: Fillings on the right somewhat limit the study. Infiltration of subcutaneous fat is seen along the right side of the mandible consistent with cellulitis. No soft tissue abscess is identified. Multiple large cavities are identified and most conspicuous in the upper premolars and first molars bilaterally and the second molar on the right. Large periapical lucencies are seen about the upper premolars bilaterally. Although no cortical disruption is identified on this examination, dental disease is likely the source of the patient's right facial cellulitis.  Small mucous retention cyst or polyp is seen off the medial  wall of the right maxillary sinus measuring 0.9 cm in diameter. The paranasal sinuses are otherwise clear. The globes are intact and the lenses are located bilaterally. Orbital fat appears clear. Major salivary glands are unremarkable. Imaged intracranial contents appear normal.  IMPRESSION: Extensive dental disease with periapical abscesses about the upper premolars bilaterally. This is likely the source of cellulitis on the right side of the face. No soft tissue abscess is identified.  Small mucous retention cyst or polyp right maxillary sinus.   Electronically Signed   By: Drusilla Kanner M.D.   On: 12/23/2014 09:47     EKG Interpretation None      MDM   Final diagnoses:  Dental abscess  Facial cellulitis     patient with right facial swelling, tender to palpation right maxillary sinus, right nasal mucosal swelling noted, poor dentition, suspect facial abscess versus cellulitis most likely from dental infection. Pt already on amoxicillin with worsening  symptoms. Will get CT maxillofacial for evaluation.     10:30 AM CT showing multiple periapical abscesses with right sided facial cellulitis. Pt given  IV clindamycin. She received pain medications in ED. Plan to d/c home with clindamycin and follow up with an oral surgeon. --> do not have one on call. Will recommend some, however, pt will have to make an apt and payments. Pt is afebrile and otherwise non toxic appearing.   Filed Vitals:   12/23/14 0815 12/23/14 0830 12/23/14 0845 12/23/14 1015  BP: 152/101 154/99 161/103 142/96  Pulse: 99 94 92 95  Resp:    18  SpO2: 100% 98% 100% 97%     Jaynie Crumble, PA-C 12/23/14 1326  Rolan Bucco, MD 12/23/14 1527

## 2014-12-23 NOTE — Discharge Instructions (Signed)
Ibuprofen for pain. Percocet for severe pain. Clindamycin until all gone for infection. Follow up with oral surgery. Return if your symptoms worsen.    Abscessed Tooth An abscessed tooth is an infection around your tooth. It may be caused by holes or damage to the tooth (cavity) or a dental disease. An abscessed tooth causes mild to very bad pain in and around the tooth. See your dentist right away if you have tooth or gum pain. HOME CARE  Take your medicine as told. Finish it even if you start to feel better.  Do not drive after taking pain medicine.  Rinse your mouth (gargle) often with salt water ( teaspoon salt in 8 ounces of warm water).  Do not apply heat to the outside of your face. GET HELP RIGHT AWAY IF:   You have a temperature by mouth above 102 F (38.9 C), not controlled by medicine.  You have chills and a very bad headache.  You have problems breathing or swallowing.  Your mouth will not open.  You develop puffiness (swelling) on the neck or around the eye.  Your pain is not helped by medicine.  Your pain is getting worse instead of better. MAKE SURE YOU:   Understand these instructions.  Will watch your condition.  Will get help right away if you are not doing well or get worse. Document Released: 01/30/2008 Document Revised: 11/05/2011 Document Reviewed: 11/21/2010 [facility] Patient Information 2015 [place], [place]. This information is not intended to replace advice given to you by your health care provider. Make sure you discuss any questions you have with your health care provider.   Cellulitis Cellulitis is an infection of the skin and the tissue under the skin. The infected area is usually red and tender. This happens most often in the arms and lower legs. HOME CARE   Take your antibiotic medicine as told. Finish the medicine even if you start to feel better.  Keep the infected arm or leg raised (elevated).  Put a warm cloth on the area up to 4  times per day.  Only take medicines as told by your doctor.  Keep all doctor visits as told. GET HELP IF:  You see red streaks on the skin coming from the infected area.  Your red area gets bigger or turns a dark color.  Your bone or joint under the infected area is painful after the skin heals.  Your infection comes back in the same area or different area.  You have a puffy (swollen) bump in the infected area.  You have new symptoms.  You have a fever. GET HELP RIGHT AWAY IF:   You feel very sleepy.  You throw up (vomit) or have watery poop (diarrhea).  You feel sick and have muscle aches and pains. MAKE SURE YOU:   Understand these instructions.  Will watch your condition.  Will get help right away if you are not doing well or get worse. Document Released: 01/30/2008 Document Revised: 12/28/2013 Document Reviewed: 10/29/2011 [facility] Patient Information 2015 [place], [place]. This information is not intended to replace advice given to you by your health care provider. Make sure you discuss any questions you have with your health care provider.

## 2014-12-23 NOTE — ED Notes (Signed)
Pt was dx with inflammted sinus polyp and placed on oral antibiotics and flonase. Left sided facial swelling worse since vist to [facility] on Tues. Has been on antibiotics for 2 days now.

## 2014-12-23 NOTE — ED Notes (Signed)
NAD at this time.  

## 2015-06-24 ENCOUNTER — Encounter (HOSPITAL_COMMUNITY): Payer: Self-pay | Admitting: *Deleted

## 2015-06-24 ENCOUNTER — Emergency Department (HOSPITAL_COMMUNITY)
Admission: EM | Admit: 2015-06-24 | Discharge: 2015-06-25 | Disposition: A | Discharge status: Home / Self Care | Payer: Self-pay | Attending: Emergency Medicine | Admitting: Emergency Medicine

## 2015-06-24 DIAGNOSIS — Z792 Long term (current) use of antibiotics: Secondary | ICD-10-CM | POA: Insufficient documentation

## 2015-06-24 DIAGNOSIS — J45909 Unspecified asthma, uncomplicated: Secondary | ICD-10-CM | POA: Insufficient documentation

## 2015-06-24 DIAGNOSIS — B353 Tinea pedis: Secondary | ICD-10-CM | POA: Insufficient documentation

## 2015-06-24 DIAGNOSIS — I10 Essential (primary) hypertension: Secondary | ICD-10-CM | POA: Insufficient documentation

## 2015-06-24 DIAGNOSIS — Z79899 Other long term (current) drug therapy: Secondary | ICD-10-CM | POA: Insufficient documentation

## 2015-06-24 DIAGNOSIS — Z7951 Long term (current) use of inhaled steroids: Secondary | ICD-10-CM | POA: Insufficient documentation

## 2015-06-24 DIAGNOSIS — Z720 Tobacco use: Secondary | ICD-10-CM | POA: Insufficient documentation

## 2015-06-24 DIAGNOSIS — L089 Local infection of the skin and subcutaneous tissue, unspecified: Secondary | ICD-10-CM

## 2015-06-24 DIAGNOSIS — G43909 Migraine, unspecified, not intractable, without status migrainosus: Secondary | ICD-10-CM | POA: Insufficient documentation

## 2015-06-24 DIAGNOSIS — L723 Sebaceous cyst: Secondary | ICD-10-CM | POA: Insufficient documentation

## 2015-06-24 MED ORDER — LIDOCAINE HCL (PF) 1 % IJ SOLN
10.0000 mL | Freq: Once | INTRAMUSCULAR | Status: AC
  Administered 2015-06-24: 10 mL
  Filled 2015-06-24: qty 10

## 2015-06-24 NOTE — ED Provider Notes (Signed)
CSN: [id number]     Arrival date & time 06/24/15  2152 History  By signing my name below, I, Gonzella Lex, attest that this documentation has been prepared under the direction and in the presence of Arthor Captain, PA-C Electronically Signed: Gonzella Lex, Scribe. 06/24/2015. 11:17 PM.  Chief Complaint  Patient presents with  . Foot Pain   The history is provided by the patient. No language interpreter was used.    HPI Comments: Cindy Peters is a 44 y.o. female who presents to the Emergency Department complaining of moderate right great toe pain that began today. Pt reports recent swelling to area on bottom of the toe and reports difficulty walking today due to pain. Pt denies any draining from toe, fevers and chills. Pt reports history of HTN and denies history of DM. Pt reports previous history of boils/abscesses.   Past Medical History  Diagnosis Date  . Migraines   . Asthma   . Hypertension    History reviewed. No pertinent past surgical history. Family History  Problem Relation Age of Onset  . Diabetes Father   . Heart attack Father   . Hypertension Father   . Cancer Father     Colon Cancer  . Hyperlipidemia Father   . Stroke Father   . Diabetes Mother   . Hypertension Mother   . Hyperlipidemia Mother   . Diabetes Sister   . Diabetes Other    Social History  Substance Use Topics  . Smoking status: Current Every Day Smoker -- 0.50 packs/day for 10 years    Types: Cigarettes  . Smokeless tobacco: Never Used  . Alcohol Use: No   OB History    Gravida Para Term Preterm AB TAB SAB Ectopic Multiple Living       Review of Systems  Constitutional: Negative for fever and chills.  Musculoskeletal: Positive for myalgias and arthralgias.       Right great toe     Allergies  Vicodin  Home Medications   Prior to Admission medications   Medication Sig Start Date End Date Taking? Authorizing Provider  ALPRAZolam Prudy Feeler) 0.5 MG  tablet Take 1 tablet (0.5 mg total) by mouth at bedtime as needed for sleep. 08/08/12   Lorre Munroe, NP  amLODipine-olmesartan (AZOR) 5-20 MG per tablet Take 1 tablet by mouth daily.    Historical Provider, MD  amoxicillin (AMOXIL) 500 MG capsule Take 1 capsule (500 mg total) by mouth 3 (three) times daily. 12/21/14   Robyn M Hess, PA-C  clindamycin (CLEOCIN) 150 MG capsule Take 2 capsules (300 mg total) by mouth 3 (three) times daily. 12/23/14   Tatyana Kirichenko, PA-C  cyclobenzaprine (FLEXERIL) 10 MG tablet Take 1 tablet (10 mg total) by mouth 2 (two) times daily as needed for muscle spasms. Patient not taking: Reported on 12/23/2014 10/19/14   Elpidio Anis, PA-C  diazepam (VALIUM) 5 MG tablet Take 1 tablet (5 mg total) by mouth every 12 (twelve) hours as needed for muscle spasms. Patient not taking: Reported on 12/23/2014 10/21/14   Kathrynn Speed, PA-C  fluticasone [facility]) 50 MCG/ACT nasal spray Place 2 sprays into both nostrils daily. 12/21/14   Robyn M Hess, PA-C  ibuprofen (ADVIL,MOTRIN) 600 MG tablet Take 1 tablet (600 mg total) by mouth every 6 (six) hours as needed. 12/23/14   Tatyana Kirichenko, PA-C  methocarbamol (ROBAXIN) 500 MG tablet Take 1 tablet (500 mg total) by mouth 3 (  three) times daily. Patient not taking: Reported on 12/23/2014 05/11/14   April Palumbo, MD  oxyCODONE-acetaminophen (PERCOCET) 5-325 MG per tablet Take 1 tablet by mouth every 4 (four) hours as needed for severe pain. 12/23/14   Tatyana Kirichenko, PA-C  terbinafine (LAMISIL AT) 1 % cream Apply 1 application topically 2 (two) times daily. To the feet (avoid recent incision). 06/25/15   Arthor CaptainAbigail Keller Bounds, PA-C  topiramate (TOPAMAX) 200 MG tablet Take 1 tablet (200 mg total) by mouth 2 (two) times daily. Patient not taking: Reported on 12/23/2014 08/08/12   Lorre Munroeegina W Baity, NP  traMADol (ULTRAM) 50 MG tablet Take 1 tablet (50 mg total) by mouth every 6 (six) hours as needed. 06/25/15   Taylore Hinde, PA-C   BP 161/105 mmHg   Pulse 87  Temp(Src) 98.7 F (37.1 C) (Oral)  Resp 18  Ht 5\' 4"  (1.626 m)  Wt 190 lb 1 oz (86.212 kg)  BMI 32.61 kg/m2  SpO2 97%  LMP 06/24/2015 Physical Exam  Constitutional: She is oriented to person, place, and time. She appears well-developed and well-nourished. No distress.  HENT:  Head: Normocephalic.  Eyes: Conjunctivae are normal.  Cardiovascular: Normal rate.   Pulmonary/Chest: Effort normal.  Abdominal: She exhibits no distension.  Neurological: She is alert and oriented to person, place, and time.  Skin: Skin is warm and dry.  Right great toe 3 cm area of fluctuance and tenderness  Dry scaling of foot and a moccasin distribution   Psychiatric: She has a normal mood and affect.  Nursing note and vitals reviewed.   ED Course  Procedures  DIAGNOSTIC STUDIES:    Oxygen Saturation is 98% on room air, normal by my interpretation.   COORDINATION OF CARE:  10:11 PM Will perform I&D procedure. Discussed treatment plan with pt at bedside and pt agreed to plan.   INCISION AND DRAINAGE PROCEDURE NOTE: Patient identification was confirmed and verbal consent was obtained. This procedure was performed by Arthor CaptainAbigail Jadin Kagel, PA-C at 11:28 PM. Site: Right toe Sterile procedures observed Anesthetic used (type and amt): 10 mL 1% lidocaine for digital block Blade size: 11 Drainage: Sebaceous Complexity: Complex Site anesthetized, incision made over site, wound drained and explored loculations, rinsed with copious amounts of normal saline, wound packed with sterile gauze, covered with dry, sterile dressing.  Pt tolerated procedure well without complications.  Instructions for care discussed verbally and pt provided with additional written instructions for homecare and f/u.   Labs Review Labs Reviewed - No data to display  Imaging Review No results found. I have personally reviewed and evaluated these images and lab results as part of my medical decision-making.   EKG  Interpretation None      MDM   Final diagnoses:  Infected sebaceous cyst  Tinea pedis of both feet  Essential hypertension    Patient with infected or inflamed sebaceous cyst of the base of the right great toe. I was able to fully excise the cyst wall. It was cleansed. Pressure dressing applied. Her safe for discharge at this time. Will treat tinea pedis with Lamisil.  I personally performed the services described in this documentation, which was scribed in my presence. The recorded information has been reviewed and is accurate.       Arthor CaptainAbigail Kein Carlberg, PA-C 06/25/15 [id number]  Mirian MoMatthew Gentry, MD 06/30/15 [phone]

## 2015-06-24 NOTE — ED Notes (Signed)
The pt has a callus  On the bottom of her rt great tor one months increasing pain,.

## 2015-06-25 MED ORDER — TRAMADOL HCL 50 MG PO TABS: 50.0000 mg | ORAL_TABLET | Freq: Four times a day (QID) | ORAL | Status: AC | PRN

## 2015-06-25 MED ORDER — TERBINAFINE HCL 1 % EX CREA: 1.0000 "application " | TOPICAL_CREAM | Freq: Two times a day (BID) | CUTANEOUS | Status: AC

## 2015-06-25 NOTE — Discharge Instructions (Signed)
You may take showers as you normally would. Do not take bath or soak thr foot in dirty water. Incision and Drainage, Care After Refer to this sheet in the next few weeks. These instructions provide you with information on caring for yourself after your procedure. Your caregiver may also give you more specific instructions. Your treatment has been planned according to current medical practices, but problems sometimes occur. Call your caregiver if you have any problems or questions after your procedure. HOME CARE INSTRUCTIONS   If antibiotic medicine is given, take it as directed. Finish it even if you start to feel better.  Only take over-the-counter or prescription medicines for pain, discomfort, or fever as directed by your caregiver.  Keep all follow-up appointments as directed by your caregiver.  Change any bandages (dressings) as directed by your caregiver. Replace old dressings with clean dressings.  Wash your hands before and after caring for your wound. You will receive specific instructions for cleansing and caring for your wound.  SEEK MEDICAL CARE IF:   You have increased pain, swelling, or redness around the wound.  You have increased drainage, smell, or bleeding from the wound.  You have muscle aches, chills, or you feel generally sick.  You have a fever. MAKE SURE YOU:   Understand these instructions.  Will watch your condition.  Will get help right away if you are not doing well or get worse.   This information is not intended to replace advice given to you by your health care provider. Make sure you discuss any questions you have with your health care provider.   Document Released: 11/05/2011 Document Revised: 09/03/2014 Document Reviewed: 11/05/2011 Elsevier Interactive Patient Education 2016 Else[employer] Inc[employer]  Epidermal Cyst An epidermal cyst is sometimes called a sebaceous cyst, epidermal inclusion cyst, or infundibular cyst. These cysts usually contain a  substance that looks "pasty" or "cheesy" and may have a bad smell. This substance is a protein called keratin. Epidermal cysts are usually found on the face, neck, or trunk. They may also occur in the vaginal area or other parts of the genitalia of both men and women. Epidermal cysts are usually small, painless, slow-growing bumps or lumps that move freely under the skin. It is important not to try to pop them. This may cause an infection and lead to tenderness and swelling. CAUSES  Epidermal cysts may be caused by a deep penetrating injury to the skin or a plugged hair follicle, often associated with acne. SYMPTOMS  Epidermal cysts can become inflamed and cause:  Redness.  Tenderness.  Increased temperature of the skin over the bumps or lumps.  Grayish-white, bad smelling material that drains from the bump or lump. DIAGNOSIS  Epidermal cysts are easily diagnosed by your caregiver during an exam. Rarely, a tissue sample (biopsy) may be taken to rule out other conditions that may resemble epidermal cysts. TREATMENT   Epidermal cysts often get better and disappear on their own. They are rarely ever cancerous.  If a cyst becomes infected, it may become inflamed and tender. This may require opening and draining the cyst. Treatment with antibiotics may be necessary. When the infection is gone, the cyst may be removed with minor surgery.  Small, inflamed cysts can often be treated with antibiotics or by injecting steroid medicines.  Sometimes, epidermal cysts become large and bothersome. If this happens, surgical removal in your caregiver's office may be necessary. HOME CARE INSTRUCTIONS  Only take over-the-counter or prescription medicines as directed by your caregiver.  Take your antibiotics as directed. Finish them even if you start to feel better. SEEK MEDICAL CARE IF:   Your cyst becomes tender, red, or swollen.  Your condition is not improving or is getting worse.  You have any  other questions or concerns. MAKE SURE YOU:  Understand these instructions.  Will watch your condition.  Will get help right away if you are not doing well or get worse.   This information is not intended to replace advice given to you by your health care provider. Make sure you discuss any questions you have with your health care provider.   Document Released: 07/14/2004 Document Revised: 11/05/2011 Document Reviewed: 02/19/2011 Elsevier Interactive Patient Education 2016 Elsevier Inc.   Sebaceous Cyst Removal Sebaceous cyst removal is a procedure to remove a sac of oily material that forms under your skin (sebaceous cyst). Sebaceous cysts may also be called epidermoid cysts or keratin cysts. Normally, the skin secretes this oily material through a gland or a hair follicle. This type of cyst usually results when a skin gland or hair follicle becomes blocked. You may need this procedure if you have a sebaceous cyst that becomes large, uncomfortable, or infected. LET [facility] KNOW ABOUT:  Any allergies you have.  All medicines you are taking, including vitamins, herbs, eye drops, creams, and over-the-counter medicines.  Previous problems you or members of your family have had with the use of anesthetics.  Any blood disorders you have.  Previous surgeries you have had.  Medical conditions you have. RISKS AND COMPLICATIONS Generally, this is a safe procedure. However, problems may occur, including:  Developing another cyst.  Bleeding.  Infection.  Scarring. BEFORE THE PROCEDURE  Ask your health care provider about:  Changing or stopping your regular medicines. This is especially important if you are taking diabetes medicines or blood thinners.  Taking medicines such as aspirin and ibuprofen. These medicines can thin your blood. Do not take these medicines before your procedure if your health care provider instructs you not to.  If you have an infected  cyst, you may have to take antibiotic medicines before or after the cyst removal. Take your antibiotics as directed by your health care provider. Finish all of the medicine even if you start to feel better.  Take a shower on the morning of your procedure. Your health care provider may ask you to use a germ-killing (antiseptic) soap. PROCEDURE  You will be given a medicine that numbs the area (local anesthetic).  The skin around the cyst will be cleaned with a germ-killing solution (antiseptic).  Your health care provider will make a small surgical incision over the cyst.  The cyst will be separated from the surrounding tissues that are under your skin.  If possible, the cyst will be removed undamaged (intact).  If the cyst bursts (ruptures), it will need to be removed in pieces.  After the cyst is removed, your health care provider will control any bleeding and close the incision with small stitches (sutures). Small incisions may not need sutures, and the bleeding will be controlled by applying direct pressure with gauze.  Your health care provider may apply antibiotic ointment and a light bandage (dressing) over the incision. This procedure may vary among health care providers and hospitals. AFTER THE PROCEDURE  If your cyst ruptured during surgery, you may need to take antibiotic medicine. If you were prescribed an antibiotic medicine, finish all of it even if you start to feel better.  This information is not intended to replace advice given to you by your health care provider. Make sure you discuss any questions you have with your health care provider.   Document Released: 08/10/2000 Document Revised: 09/03/2014 Document Reviewed: 04/28/2014 Elsevier Interactive Patient Education [employer]

## 2015-10-24 ENCOUNTER — Encounter (HOSPITAL_COMMUNITY): Payer: Self-pay | Admitting: Radiology

## 2015-10-24 ENCOUNTER — Emergency Department (HOSPITAL_COMMUNITY)
Admission: EM | Admit: 2015-10-24 | Discharge: 2015-10-25 | Disposition: A | Discharge status: Home / Self Care | Payer: No Typology Code available for payment source | Attending: Emergency Medicine | Admitting: Emergency Medicine

## 2015-10-24 ENCOUNTER — Emergency Department (HOSPITAL_COMMUNITY): Payer: No Typology Code available for payment source

## 2015-10-24 DIAGNOSIS — G43909 Migraine, unspecified, not intractable, without status migrainosus: Secondary | ICD-10-CM

## 2015-10-24 DIAGNOSIS — I10 Essential (primary) hypertension: Secondary | ICD-10-CM | POA: Insufficient documentation

## 2015-10-24 DIAGNOSIS — J45909 Unspecified asthma, uncomplicated: Secondary | ICD-10-CM | POA: Insufficient documentation

## 2015-10-24 DIAGNOSIS — F17210 Nicotine dependence, cigarettes, uncomplicated: Secondary | ICD-10-CM | POA: Insufficient documentation

## 2015-10-24 DIAGNOSIS — G43109 Migraine with aura, not intractable, without status migrainosus: Secondary | ICD-10-CM | POA: Insufficient documentation

## 2015-10-24 LAB — COMPREHENSIVE METABOLIC PANEL
ALBUMIN: 3.7 g/dL (ref 3.5–5.0)
ALK PHOS: 65 U/L (ref 38–126)
ALT: 14 U/L (ref 14–54)
ANION GAP: 12 (ref 5–15)
AST: 19 U/L (ref 15–41)
BUN: 7 mg/dL (ref 6–20)
CALCIUM: 9 mg/dL (ref 8.9–10.3)
CHLORIDE: 102 mmol/L (ref 101–111)
CO2: 24 mmol/L (ref 22–32)
Creatinine, Ser: 0.87 mg/dL (ref 0.44–1.00)
GFR calc Af Amer: >60 mL/min (ref >60)
GFR calc non Af Amer: >60 mL/min (ref >60)
GLUCOSE: 104 mg/dL — AB (ref 65–99)
Potassium: 3.6 mmol/L (ref 3.5–5.1)
SODIUM: 138 mmol/L (ref 135–145)
Total Bilirubin: 0.4 mg/dL (ref 0.3–1.2)
Total Protein: 7.1 g/dL (ref 6.5–8.1)

## 2015-10-24 LAB — I-STAT CHEM 8, ED
BUN: 8 mg/dL (ref 6–20)
CHLORIDE: 100 mmol/L — AB (ref 101–111)
Calcium, Ion: 1.17 mmol/L (ref 1.12–1.23)
Creatinine, Ser: 0.9 mg/dL (ref 0.44–1.00)
GLUCOSE: 101 mg/dL — AB (ref 65–99)
HEMATOCRIT: 47 % — AB (ref 36.0–46.0)
Hemoglobin: 16 g/dL — ABNORMAL HIGH (ref 12.0–15.0)
POTASSIUM: 3.5 mmol/L (ref 3.5–5.1)
SODIUM: 139 mmol/L (ref 135–145)
TCO2: 25 mmol/L (ref 0–100)

## 2015-10-24 LAB — CBC
HCT: 41.6 % (ref 36.0–46.0)
Hemoglobin: 14.1 g/dL (ref 12.0–15.0)
MCH: 30.3 pg (ref 26.0–34.0)
MCHC: 33.9 g/dL (ref 30.0–36.0)
MCV: 89.3 fL (ref 78.0–100.0)
PLATELETS: 269 10*3/uL (ref 150–400)
RBC: 4.66 MIL/uL (ref 3.87–5.11)
RDW: 12.8 % (ref 11.5–15.5)
WBC: 6.1 10*3/uL (ref 4.0–10.5)

## 2015-10-24 LAB — DIFFERENTIAL
Basophils Absolute: 0 10*3/uL (ref 0.0–0.1)
Basophils Relative: 0 %
EOS PCT: 3 %
Eosinophils Absolute: 0.2 10*3/uL (ref 0.0–0.7)
LYMPHS PCT: 35 %
Lymphs Abs: 2.2 10*3/uL (ref 0.7–4.0)
Monocytes Absolute: 0.4 10*3/uL (ref 0.1–1.0)
Monocytes Relative: 6 %
NEUTROS ABS: 3.4 10*3/uL (ref 1.7–7.7)
NEUTROS PCT: 56 %

## 2015-10-24 LAB — PROTIME-INR
INR: 1.01 (ref 0.00–1.49)
PROTHROMBIN TIME: 13.5 s (ref 11.6–15.2)

## 2015-10-24 LAB — I-STAT TROPONIN, ED: Troponin i, poc: 0 ng/mL (ref 0.00–0.08)

## 2015-10-24 LAB — APTT: aPTT: 29 seconds (ref 24–37)

## 2015-10-24 LAB — CBG MONITORING, ED: GLUCOSE-CAPILLARY: 96 mg/dL (ref 65–99)

## 2015-10-24 MED ORDER — KETOROLAC TROMETHAMINE 30 MG/ML IJ SOLN
30.0000 mg | Freq: Once | INTRAMUSCULAR | Status: AC
  Administered 2015-10-24: 30 mg via INTRAVENOUS
  Filled 2015-10-24: qty 1

## 2015-10-24 MED ORDER — SODIUM CHLORIDE 0.9 % IV BOLUS (SEPSIS)
1000.0000 mL | Freq: Once | INTRAVENOUS | Status: AC
  Administered 2015-10-24: 1000 mL via INTRAVENOUS

## 2015-10-24 MED ORDER — PROCHLORPERAZINE EDISYLATE 5 MG/ML IJ SOLN
10.0000 mg | Freq: Four times a day (QID) | INTRAMUSCULAR | Status: DC | PRN
  Administered 2015-10-24: 10 mg via INTRAVENOUS
  Filled 2015-10-24: qty 2

## 2015-10-24 MED ORDER — DIPHENHYDRAMINE HCL 50 MG/ML IJ SOLN
25.0000 mg | Freq: Once | INTRAMUSCULAR | Status: AC
  Administered 2015-10-24: 25 mg via INTRAVENOUS
  Filled 2015-10-24: qty 1

## 2015-10-24 NOTE — ED Notes (Signed)
Patient arrives with complaint of left sided arm numbness and weakness. Onset was tonight at 1700 while sitting at table. Endorses headache for duration of day. History of chronic headaches, but never with this type of presentation. Some weakness noted in left arm as well. Numbness extends to upper left chest and shoulder. Also reports some numbness in left leg.

## 2015-10-24 NOTE — ED Provider Notes (Signed)
CSN: [id number]     Arrival date & time 10/24/15  2231 History    By signing my name below, I, Arlan Organ, attest that this documentation has been prepared under the direction and in the presence of Shon Baton, MD.  Electronically Signed: Arlan Organ, ED Scribe. 10/24/2015. 11:35 PM.   Chief Complaint  Patient presents with  . Headache  . Numbness  . Code Stroke   The history is provided by the patient. No language interpreter was used.    HPI Comments: TYTIONNA CLOYD is a 45 y.o. female with a PMHx of migraines and HTN who presents to the Emergency Department complaining of constant, ongoing L sided numbness and weakness that extends from upper L chest down to L leg onset 5:00 PM this evening while sitting at her table. Pt also reports an ongoing 10/10 HA with associated photophobia x 1 day. No aggravating or alleviating factors at this time. No OTC medications or home remedies attempted prior to arrival. No recent fever, chills, nausea, vomiting, chest pain, shortness of breath, congestion, or cough.  Patient evaluated by neurology as a code stroke. Given headache, presentation thought to be secondary to, Migraine. However, if patient cannot be controlled and has persistent paresthesias of the left side, she will need to be admitted for a stroke w/u.  PCP: No PCP Per Patient    Past Medical History  Diagnosis Date  . Migraines   . Asthma   . Hypertension    No past surgical history on file. Family History  Problem Relation Age of Onset  . Diabetes Father   . Heart attack Father   . Hypertension Father   . Cancer Father     Colon Cancer  . Hyperlipidemia Father   . Stroke Father   . Diabetes Mother   . Hypertension Mother   . Hyperlipidemia Mother   . Diabetes Sister   . Diabetes Other    Social History  Substance Use Topics  . Smoking status: Current Every Day Smoker -- 0.50 packs/day for 10 years    Types: Cigarettes  . Smokeless tobacco: Never Used  .  Alcohol Use: No   OB History    Gravida Para Term Preterm AB TAB SAB Ectopic Multiple Living       Review of Systems  Constitutional: Negative for fever and chills.  HENT: Negative for congestion.   Eyes: Positive for photophobia.  Respiratory: Negative for cough and shortness of breath.   Cardiovascular: Negative for chest pain.  Gastrointestinal: Negative for nausea, vomiting and abdominal pain.  Neurological: Positive for weakness, numbness and headaches.  Psychiatric/Behavioral: Negative for confusion.  All other systems reviewed and are negative.     Allergies  Vicodin  Home Medications   Prior to Admission medications   Medication Sig Start Date End Date Taking? Authorizing Provider  ALPRAZolam Prudy Feeler) 0.5 MG tablet Take 1 tablet (0.5 mg total) by mouth at bedtime as needed for sleep. Patient not taking: Reported on 10/25/2015 08/08/12   Lorre Munroe, NP  amoxicillin (AMOXIL) 500 MG capsule Take 1 capsule (500 mg total) by mouth 3 (three) times daily. Patient not taking: Reported on 10/25/2015 12/21/14   Kathrynn Speed, PA-C  clindamycin (CLEOCIN) 150 MG capsule Take 2 capsules (300 mg total) by mouth 3 (three) times daily. Patient not taking: Reported on 10/25/2015 12/23/14   Tatyana Kirichenko, PA-C  cyclobenzaprine (FLEXERIL) 10 MG tablet Take 1  tablet (10 mg total) by mouth 2 (two) times daily as needed for muscle spasms. Patient not taking: Reported on 12/23/2014 10/19/14   Elpidio Anis, PA-C  diazepam (VALIUM) 5 MG tablet Take 1 tablet (5 mg total) by mouth every 12 (twelve) hours as needed for muscle spasms. Patient not taking: Reported on 12/23/2014 10/21/14   Kathrynn Speed, PA-C  fluticasone [facility]) 50 MCG/ACT nasal spray Place 2 sprays into both nostrils daily. Patient not taking: Reported on 10/25/2015 12/21/14   Kathrynn Speed, PA-C  ibuprofen (ADVIL,MOTRIN) 600 MG tablet Take 1 tablet (600 mg total) by mouth every 6 (six) hours as needed. Patient  not taking: Reported on 10/25/2015 12/23/14   Jaynie Crumble, PA-C  methocarbamol (ROBAXIN) 500 MG tablet Take 1 tablet (500 mg total) by mouth 3 (three) times daily. Patient not taking: Reported on 12/23/2014 05/11/14   April Palumbo, MD  oxyCODONE-acetaminophen (PERCOCET) 5-325 MG per tablet Take 1 tablet by mouth every 4 (four) hours as needed for severe pain. Patient not taking: Reported on 10/25/2015 12/23/14   Tatyana Kirichenko, PA-C  terbinafine (LAMISIL AT) 1 % cream Apply 1 application topically 2 (two) times daily. To the feet (avoid recent incision). Patient not taking: Reported on 10/25/2015 06/25/15   Arthor Captain, PA-C  topiramate (TOPAMAX) 200 MG tablet Take 1 tablet (200 mg total) by mouth 2 (two) times daily. 10/25/15   Shon Baton, MD  traMADol (ULTRAM) 50 MG tablet Take 1 tablet (50 mg total) by mouth every 6 (six) hours as needed. Patient not taking: Reported on 10/25/2015 06/25/15   Arthor Captain, PA-C   Triage Vitals: BP 160/100 mmHg  Pulse 93  Temp(Src) 98.1 F (36.7 C) (Oral)  Resp 16  Ht 5\' 5"  (1.651 m)  Wt 199 lb 6 oz (90.436 kg)  BMI 33.18 kg/m2  SpO2 99%   Physical Exam  Constitutional: She is oriented to person, place, and time. She appears well-developed and well-nourished. No distress.  HENT:  Head: Normocephalic and atraumatic.  Eyes: EOM are normal. Pupils are equal, round, and reactive to light.  Neck: Normal range of motion. Neck supple.  Cardiovascular: Normal rate, regular rhythm and normal heart sounds.   No murmur heard. Pulmonary/Chest: Effort normal and breath sounds normal. No respiratory distress. She has no wheezes.  Abdominal: Soft. Bowel sounds are normal. There is no tenderness. There is no rebound.  Neurological: She is alert and oriented to person, place, and time.  Cranial nerves II through XII intact, 5 out of 5 strength in all 4 extremities, hypersensitive to touch left upper and lower extremity, subjective increase in sensation  on the left side  Skin: Skin is warm and dry.  Psychiatric: She has a normal mood and affect.  Nursing note and vitals reviewed.   ED Course  Procedures (including critical care time)  DIAGNOSTIC STUDIES: Oxygen Saturation is 97% on RA, Normal by my interpretation.    COORDINATION OF CARE: 11:31 PM-Discussed treatment plan with pt at bedside and pt agreed to plan.     Labs Review Labs Reviewed  COMPREHENSIVE METABOLIC PANEL - Abnormal; Notable for the following:    Glucose, Bld 104 (*)    All other components within normal limits  I-STAT CHEM 8, ED - Abnormal; Notable for the following:    Chloride 100 (*)    Glucose, Bld 101 (*)    Hemoglobin 16.0 (*)    HCT 47.0 (*)    All other components within normal limits  PROTIME-INR  APTT  CBC  DIFFERENTIAL  I-STAT TROPOININ, ED  CBG MONITORING, ED    Imaging Review Ct Head Wo Contrast  10/24/2015  CLINICAL DATA:  44 year old female with headache and left eye pain and blurry vision. Left facial and left arm tingling and sensitivity. EXAM: CT HEAD WITHOUT CONTRAST TECHNIQUE: Contiguous axial images were obtained from the base of the skull through the vertex without intravenous contrast. COMPARISON:  CT dated 07/15/2007 FINDINGS: The ventricles and the sulci are appropriate in size for the patient's age. There is no intracranial hemorrhage. No midline shift or mass effect identified. The gray-white matter differentiation is preserved. The visualized paranasal sinuses and mastoid air cells are well aerated. The calvarium is intact. IMPRESSION: No acute intracranial pathology. These results were called by telephone at the time of interpretation on 10/24/2015 at 11:04 pm to nurse Rocco Serene, who verbally acknowledged these results. Electronically Signed   By: Elgie Collard M.D.   On: 10/24/2015 23:06   I have personally reviewed and evaluated these images and lab results as part of my medical decision-making.   EKG  Interpretation   Date/Time:  Monday October 24 2015 23:02:28 EST Ventricular Rate:  102 PR Interval:  146 QRS Duration: 99 QT Interval:  356 QTC Calculation: 464 R Axis:   5 Text Interpretation:  Sinus tachycardia Probable left atrial enlargement  RSR' in V1 or V2, probably normal variant Baseline wander in lead(s) V6  Tachycardia when compared to prior Confirmed by HORTON  MD, COURTNEY  ([id number]) on 10/25/2015 12:02:54 AM      MDM   Final diagnoses:  Complicated migraine    Patient presents with headache and hyper paresthesia of the left side. Initially seen as a code stroke. Neurology at the bedside. Feel this is likely a complicated migraine. She has a history of same. Has not been taking her Topamax. Patient was given a migraine cocktail and fluids.  No objective focal deficits.  12:15 AM I was called to the room. Patient has complete resolution of headache and paresthesias. She is requesting discharge. She continues to be neurologically intact. Will discharge with Rx for Topamax.  After history, exam, and medical workup I feel the patient has been appropriately medically screened and is safe for discharge home. Pertinent diagnoses were discussed with the patient. Patient was given return precautions.  I personally performed the services described in this documentation, which was scribed in my presence. The recorded information has been reviewed and is accurate.   Shon Baton, MD 10/25/15 [phone]

## 2015-10-24 NOTE — ED Provider Notes (Signed)
MSE was initiated and I personally evaluated the patient and placed orders (if any) at  10:45 PM on October 24, 2015.  The patient appears stable so that the remainder of the MSE may be completed by another provider.  45 year old female who presents to the hospital with a complaint of a headache which is been present all day, she states that she gets headaches every single day but never has symptoms like this. At 5:00 PM she noticed acute onset of left sided tingling and numbness including her face, arm, leg. This is persistent for the last 5 hours and 45 minutes, she does not have any weakness, changes in vision, difficulty with speech or balance. On exam she doesn't fact have increased hyperesthesias or tingling to the left face, arm and leg. She has normal strength, she has normal coordination, normal speech, cranial nerves III through XII are normal. Stroke was activated at 10:45 PM. The patient will be gone to CT and moved to a room for appropriate evaluation by neurology.  Eber Hong, MD 10/24/15 [phone]

## 2015-10-24 NOTE — Consult Note (Signed)
Subjective: Cindy Peters is a 45 y.o. right handed female on whom I have been asked to consult for evaluation and treatment of a possible stroke. Onset of symptoms was sudden, beginning today.  The patient has a history of migraines and was experiencing her typical migraine today; e.g. Frontal throbbing headache associated with light sensitivity and nausea. About 5:00pm today she developed left face, arm and leg numbness. She has never experienced this before with a migraine. No speech changes. No weakness. No vision loss. She has a history of hypertension and smokes cigarettes. I received a stroke code page at 22:49.       Outside reports reviewed: none.  Patient Active Problem List   Diagnosis Date Noted  . Migraines   . Asthma   . Hypertension    Past Medical History  Diagnosis Date  . Migraines   . Asthma   . Hypertension    No past surgical history on file. Family History  Problem Relation Age of Onset  . Diabetes Father   . Heart attack Father   . Hypertension Father   . Cancer Father     Colon Cancer  . Hyperlipidemia Father   . Stroke Father   . Diabetes Mother   . Hypertension Mother   . Hyperlipidemia Mother   . Diabetes Sister   . Diabetes Other    Scheduled Meds:  Continuous Infusions:  PRN Meds:prochlorperazine  Allergies  Allergen Reactions  . Vicodin [Hydrocodone-Acetaminophen] Other (See Comments)    Migraines and hallucinations   Social History   Social History  . Marital Status: Married    Spouse Name: N/A  . Number of Children: N/A  . Years of Education: 12   Occupational History  . unemployed    Social History Main Topics  . Smoking status: Current Every Day Smoker -- 0.50 packs/day for 10 years    Types: Cigarettes  . Smokeless tobacco: Never Used  . Alcohol Use: No  . Drug Use: No  . Sexual Activity: Yes   Other Topics Concern  . Not on file   Social History Narrative    Review of Systems Pertinent items noted in HPI  and remainder of comprehensive ROS otherwise negative.  Objective: Vital signs in last 24 hours: Temp:  [98.1 F (36.7 C)] 98.1 F (36.7 C) (02/27 2238) Pulse Rate:  [92] 92 (02/27 2238) Resp:  [14] 14 (02/27 2238) BP: (160)/(112) 160/112 mmHg (02/27 2238) SpO2:  [97 %] 97 % (02/27 2238) Weight:  [90.436 kg (199 lb 6 oz)] 90.436 kg (199 lb 6 oz) (02/27 2238)  BP 156/117 mmHg  Pulse 92  Temp(Src) 98.1 F (36.7 C) (Oral)  Resp 17  Ht  (1.651 m)  Wt 90.436 kg (199 lb 6 oz)  BMI 33.18 kg/m2  SpO2 97%  General Appearance:    Alert, cooperative, no distress, appears stated age  Head:    Normocephalic, without obvious abnormality, atraumatic  Eyes:    PERRL, conjunctiva/corneas clear, EOM's intact, fundi    benign, both eyes  Ears:    Normal TM's and external ear canals, both ears  Nose:   Nares normal, septum midline, mucosa normal, no drainage    or sinus tenderness  Throat:   Lips, mucosa, and tongue normal; teeth and gums normal  Neck:   Supple, symmetrical, trachea midline, no adenopathy;    thyroid:  no enlargement/tenderness/nodules; no carotid   bruit or JVD     Lungs:     Clear to  auscultation bilaterally, respirations unlabored  Chest Wall:    No tenderness or deformity   Heart:    Regular rate and rhythm, S1 and S2 normal, no murmur, rub   or gallop  Breast Exam:    No tenderness, masses, or nipple abnormality           Extremities:   Extremities normal, atraumatic, no cyanosis or edema  Pulses:   2+ and symmetric all extremities  Skin:   Skin color, texture, turgor normal, no rashes or lesions  Lymph nodes:   Cervical, supraclavicular, and axillary nodes normal  Neurologic:   Alert. Oriented. Names, repeats, follow commands. CNII-XII intact, normal strength, and reflexes  Throughout. FTN and HTS intact. Patient reports decreased sensation in left face arm and leg but at the same time she is very sensitive to touch and complains of hyperalgesia to light touch of  the left side.  NIHSS = 1.     Imaging CT Head: obtained and reviewed  Lab Review Lab Results  Component Value Date   WBC 6.1 10/24/2015   RBC 4.66 10/24/2015   HGB 16.0* 10/24/2015   HCT 47.0* 10/24/2015   PLT 269 10/24/2015   Lab Results  Component Value Date   NA 139 10/24/2015   K 3.5 10/24/2015   CREATININE 0.90 10/24/2015   BUN 8 10/24/2015   Lab Results  Component Value Date   APTT 29 10/24/2015   Lab Results  Component Value Date   INR 1.01 10/24/2015    Assessment:  1. Migraine.  The abnormal sensations on the left side are likely related to her migraine. The differential diagnosis would also include stroke, but in my medical opinion this is less likely.   Recommendations: 1. Treat with a migraine cocktail. I suggest Toradol , Phenergan  and Benadryl . A different migraine medication regimen per ED physician is acceptable. Restart her migraine preventive medications including Topiramate.  2. If the left sided paresthesias persist, then she will need admission for workup of TIA/Stroke to include: MRI, MRA, TTE, Lipids, Telemetry and start aspirin.

## 2015-10-25 DIAGNOSIS — G43109 Migraine with aura, not intractable, without status migrainosus: Secondary | ICD-10-CM | POA: Insufficient documentation

## 2015-10-25 MED ORDER — TOPIRAMATE 200 MG PO TABS: 200.0000 mg | ORAL_TABLET | Freq: Two times a day (BID) | ORAL | Status: AC

## 2015-10-25 NOTE — Discharge Instructions (Signed)

## 2015-10-25 NOTE — ED Notes (Signed)
Pt states she feels much better, and she is requesting to go home now. EDP working on her dc paper.

## 2015-12-30 ENCOUNTER — Inpatient Hospital Stay (HOSPITAL_COMMUNITY)
Admission: AD | Admit: 2015-12-30 | Discharge: 2015-12-30 | Discharge status: Against Medical Advice | Payer: No Typology Code available for payment source | Source: Ambulatory Visit | Attending: Obstetrics & Gynecology | Admitting: Obstetrics & Gynecology

## 2015-12-30 ENCOUNTER — Telehealth: Payer: Self-pay | Admitting: Family

## 2015-12-30 DIAGNOSIS — A599 Trichomoniasis, unspecified: Secondary | ICD-10-CM | POA: Insufficient documentation

## 2015-12-30 LAB — URINE MICROSCOPIC-ADD ON

## 2015-12-30 LAB — URINALYSIS, ROUTINE W REFLEX MICROSCOPIC
BILIRUBIN URINE: NEGATIVE
Glucose, UA: NEGATIVE mg/dL
KETONES UR: NEGATIVE mg/dL
NITRITE: NEGATIVE
PROTEIN: NEGATIVE mg/dL
Specific Gravity, Urine: 1.015 (ref 1.005–1.030)
pH: 5.5 (ref 5.0–8.0)

## 2015-12-30 LAB — POCT PREGNANCY, URINE: PREG TEST UR: NEGATIVE

## 2015-12-30 MED ORDER — METRONIDAZOLE 500 MG PO TABS: ORAL_TABLET | ORAL | Status: AC

## 2015-12-30 MED ORDER — FLAGYL 500 MG PO TABS: 2000.0000 mg | ORAL_TABLET | Freq: Once | ORAL | Status: AC

## 2015-12-30 NOTE — MAU Provider Note (Signed)
Pt left AMA; urine results show +trichomoniasis.  Pt called and notified regarding positive results.  Information patient that it is a sexually transmitted infection and that both her and her partner will need to be treated.  [facility] Flag[facility] gm [facility] on [address].

## 2015-12-30 NOTE — MAU Note (Addendum)
For the past 3 days has been having sharp pain in her ovaries, feels like cycle is going to start. Cycle is late, did not do home test.  Been feeling nauseated today and yesterday.

## 2015-12-30 NOTE — Telephone Encounter (Signed)
Pt notified regarding trich, plans to pick up RX.

## 2017-03-05 IMAGING — CT CT HEAD W/O CM
2 series · 15 of 30 positions shown, 17 images · non-contrast
Comparison: CT dated 07/15/2007

CLINICAL DATA: 44-year-old female with headache and left eye pain
and blurry vision. Left facial and left arm tingling and
sensitivity.

EXAM:
CT HEAD WITHOUT CONTRAST
TECHNIQUE: Contiguous axial images were obtained from the base of the skull
through the vertex without intravenous contrast.

[Series 2: head without · axial · non-contrast · 0.41mm/px · z∈[-164,-44]mm · 7 of 32 slices shown, 9 images]
[im 4/32  brain]
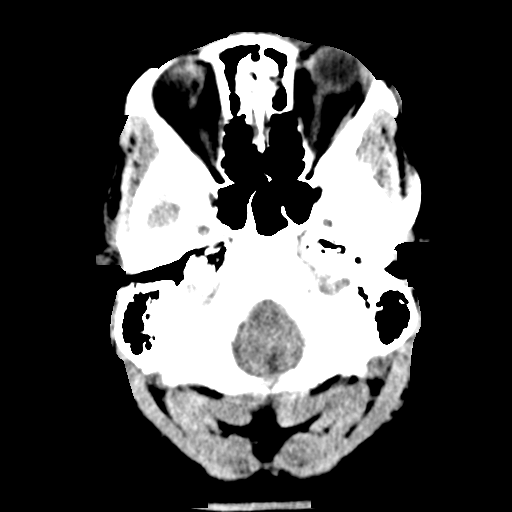
[im 4/32  bone]
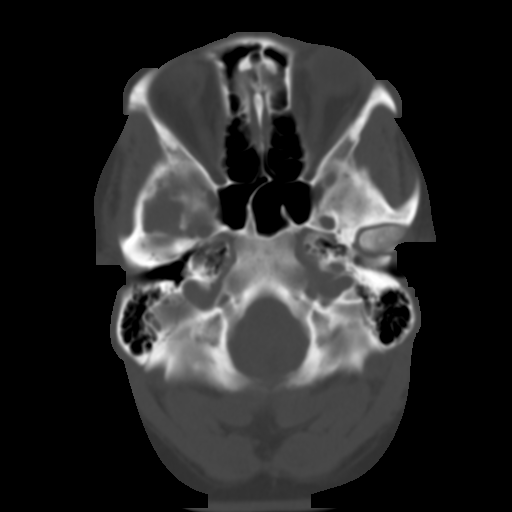
[im 8/32  brain]
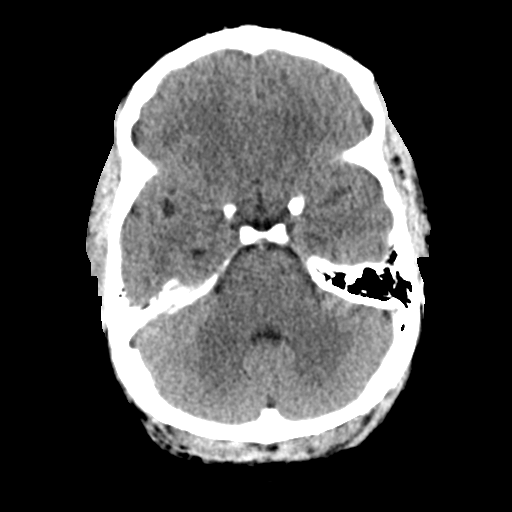
[im 12/32  brain]
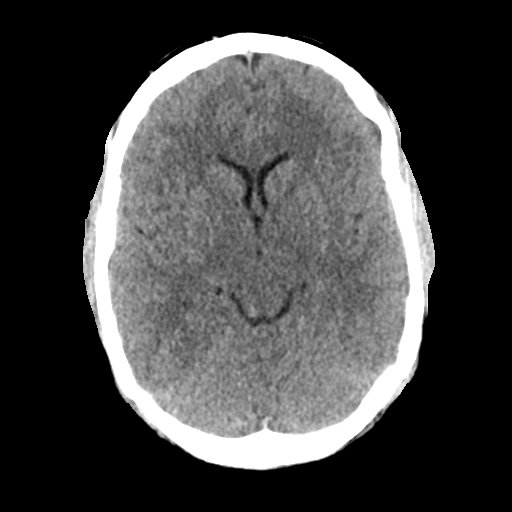
[im 16/32  brain]
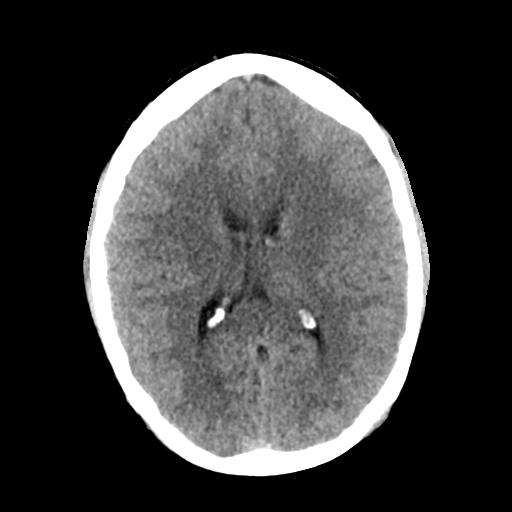
[im 20/32  brain]
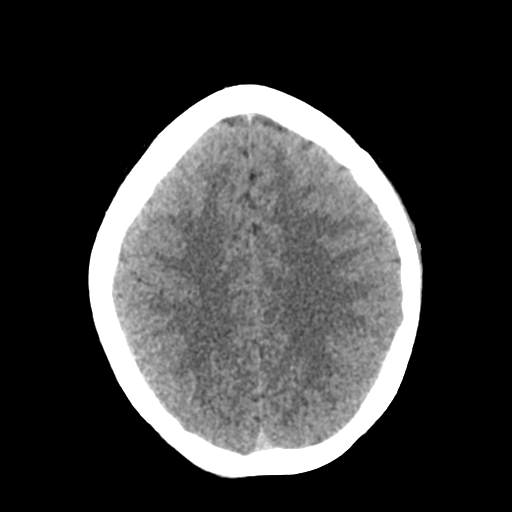
[im 20/32  bone]
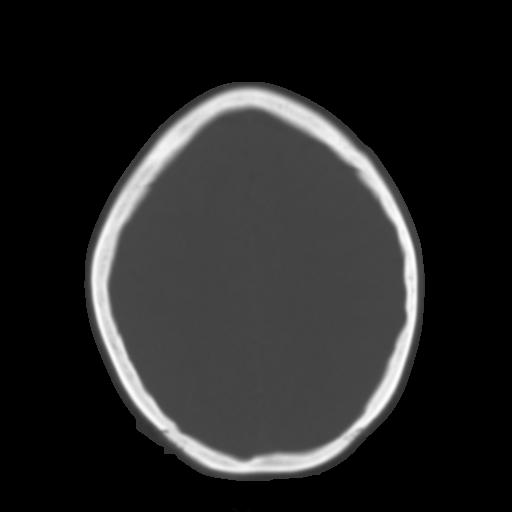
[im 24/32  brain]
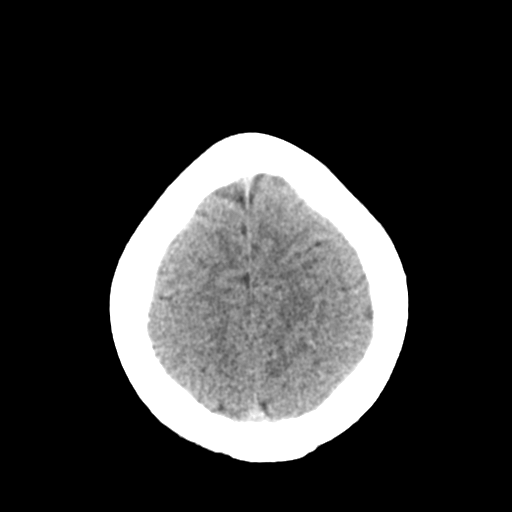
[im 28/32  brain]
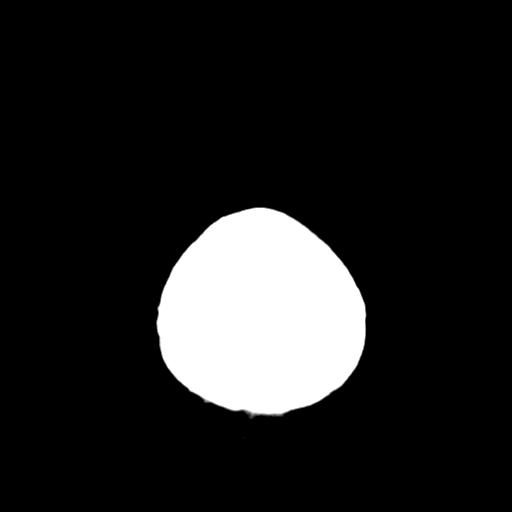

[Series 3: head bone · axial · 0.41mm/px · z∈[-165,-41]mm · 8 of 78 slices shown]
[im 8/78  bone]
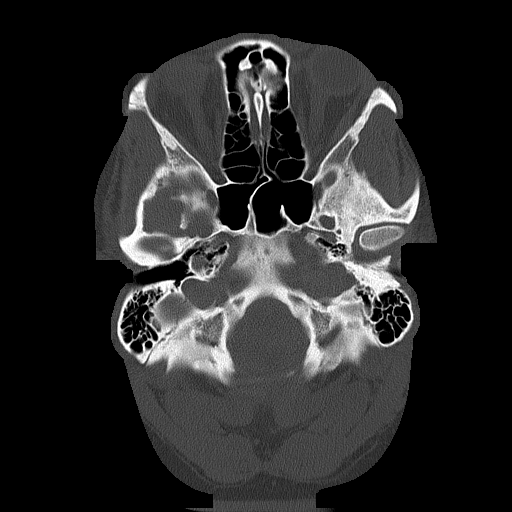
[im 16/78  bone]
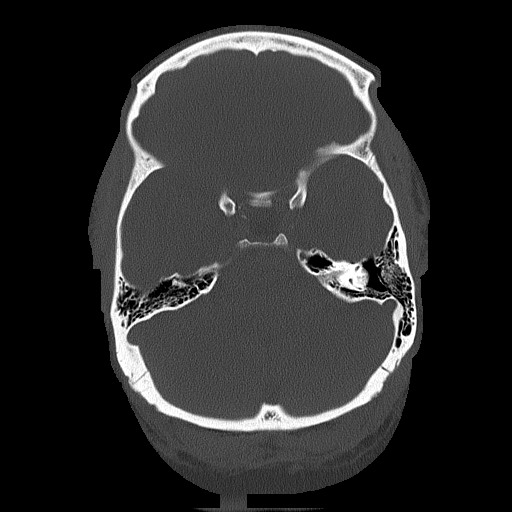
[im 24/78  bone]
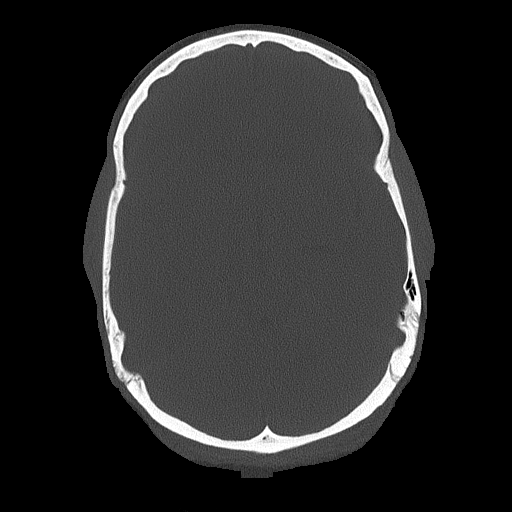
[im 35/78  bone]
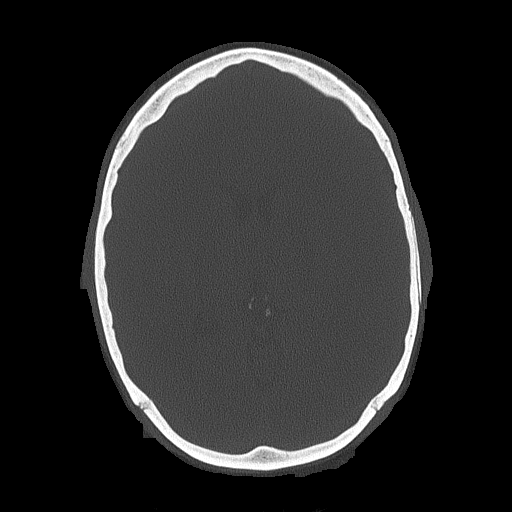
[im 43/78  bone]
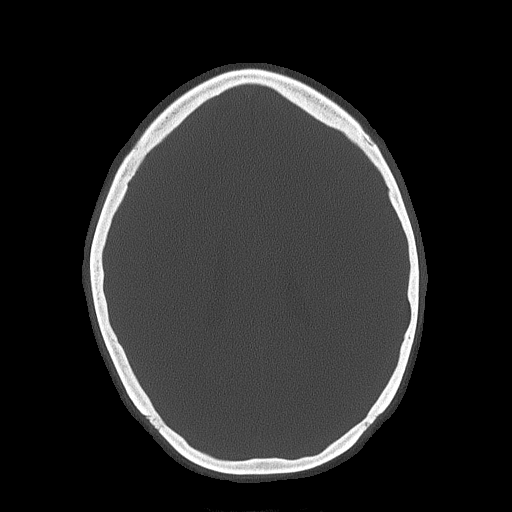
[im 54/78  bone]
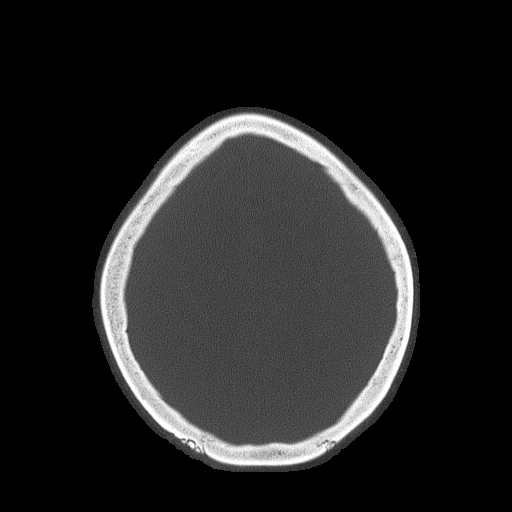
[im 62/78  bone]
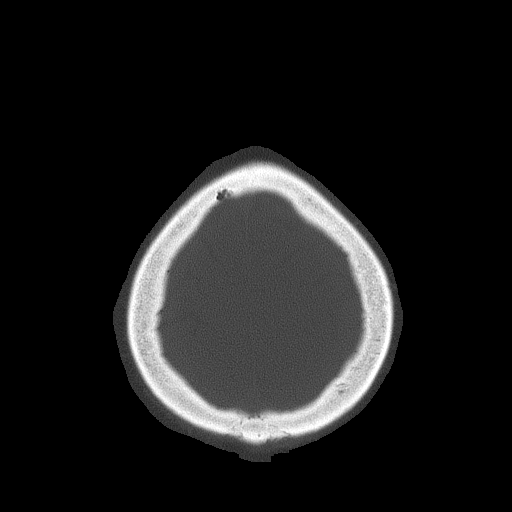
[im 70/78  bone]
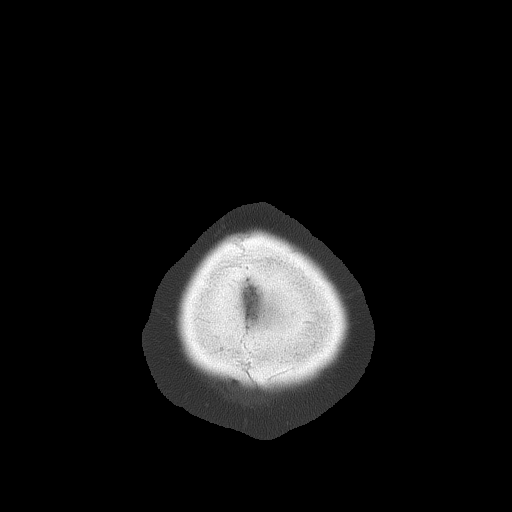

[15 of 30 positions shown; findings below may reference images not displayed]

FINDINGS: The ventricles and the sulci are appropriate in size for the
patient's age. There is no intracranial hemorrhage. No midline shift
or mass effect identified. The gray-white matter differentiation is
preserved.

The visualized paranasal sinuses and mastoid air cells are well
aerated. The calvarium is intact.
IMPRESSION: No acute intracranial pathology.

These results were called by telephone at the time of interpretation
on 10/24/2015 at [DATE] to nurse Baalakani, who verbally acknowledged
these results.
